# Patient Record
Sex: Male | Born: 1950
Health system: Southern US, Community
[De-identification: ages and names within clinical notes are randomized; demographics above are authoritative.]

## PROBLEM LIST (undated history)

## (undated) DIAGNOSIS — E119 Type 2 diabetes mellitus without complications: Secondary | ICD-10-CM

## (undated) DIAGNOSIS — R7302 Impaired glucose tolerance (oral): Secondary | ICD-10-CM

## (undated) HISTORY — PX: ANTERIOR CRUCIATE LIGAMENT REPAIR: SHX115

## (undated) HISTORY — PX: EYE SURGERY: SHX253

## (undated) HISTORY — DX: Type 2 diabetes mellitus without complications: E11.9

## (undated) HISTORY — DX: Impaired glucose tolerance (oral): R73.02

## (undated) HISTORY — PX: ORIF ACROMIOCLAVICULAR JOINT: SUR916

---

## 2004-10-07 ENCOUNTER — Ambulatory Visit: Payer: Self-pay | Admitting: Gastroenterology

## 2004-10-15 ENCOUNTER — Ambulatory Visit: Payer: Self-pay | Admitting: Gastroenterology

## 2010-12-21 ENCOUNTER — Ambulatory Visit: Payer: 59 | Admitting: Family Medicine

## 2012-01-30 ENCOUNTER — Other Ambulatory Visit: Payer: Self-pay | Admitting: Family Medicine

## 2012-01-30 ENCOUNTER — Other Ambulatory Visit: Payer: 59

## 2012-01-30 ENCOUNTER — Other Ambulatory Visit: Payer: Self-pay

## 2012-01-30 DIAGNOSIS — IMO0002 Reserved for concepts with insufficient information to code with codable children: Secondary | ICD-10-CM

## 2012-01-30 DIAGNOSIS — M719 Bursopathy, unspecified: Secondary | ICD-10-CM

## 2012-01-30 MED ORDER — AMOXICILLIN-POT CLAVULANATE 875-125 MG PO TABS: 1.0000 | ORAL_TABLET | Freq: Two times a day (BID) | ORAL | Status: AC

## 2012-01-30 MED ORDER — DOXYCYCLINE HYCLATE 100 MG PO TABS: 100.0000 mg | ORAL_TABLET | Freq: Two times a day (BID) | ORAL | Status: AC

## 2012-02-06 ENCOUNTER — Other Ambulatory Visit: Payer: Self-pay | Admitting: Family Medicine

## 2012-02-06 MED ORDER — DICLOXACILLIN SODIUM 500 MG PO CAPS: 500.0000 mg | ORAL_CAPSULE | Freq: Four times a day (QID) | ORAL | Status: AC

## 2013-01-03 ENCOUNTER — Other Ambulatory Visit: Payer: Self-pay | Admitting: Internal Medicine

## 2013-01-03 LAB — COMPREHENSIVE METABOLIC PANEL
ALT: 22 U/L (ref 0–53)
AST: 21 U/L (ref 0–37)
BUN: 22 mg/dL (ref 6–23)
Calcium: 9.4 mg/dL (ref 8.4–10.5)
Chloride: 99 mEq/L (ref 96–112)
Creatinine, Ser: 1.1 mg/dL (ref 0.4–1.5)
GFR: 71.74 mL/min (ref >60.00)
Total Bilirubin: 1.3 mg/dL — ABNORMAL HIGH (ref 0.3–1.2)

## 2013-01-03 LAB — CBC WITH DIFFERENTIAL/PLATELET
Basophils Absolute: 0 10*3/uL (ref 0.0–0.1)
Basophils Relative: 0.3 % (ref 0.0–3.0)
Eosinophils Absolute: 0.3 10*3/uL (ref 0.0–0.7)
HCT: 44.6 % (ref 39.0–52.0)
Hemoglobin: 14.9 g/dL (ref 13.0–17.0)
Lymphs Abs: 1.4 10*3/uL (ref 0.7–4.0)
MCHC: 33.4 g/dL (ref 30.0–36.0)
MCV: 92.6 fl (ref 78.0–100.0)
Neutro Abs: 2.8 10*3/uL (ref 1.4–7.7)
RBC: 4.81 Mil/uL (ref 4.22–5.81)
RDW: 13.5 % (ref 11.5–14.6)

## 2013-01-03 LAB — TSH: TSH: 2.85 u[IU]/mL (ref 0.35–5.50)

## 2013-01-03 LAB — LIPID PANEL
HDL: 58.1 mg/dL (ref >39.00)
Total CHOL/HDL Ratio: 2
Triglycerides: 54 mg/dL (ref 0.0–149.0)
VLDL: 10.8 mg/dL (ref 0.0–40.0)

## 2013-01-03 LAB — PSA: PSA: 1.35 ng/mL (ref 0.10–4.00)

## 2013-04-22 ENCOUNTER — Other Ambulatory Visit: Payer: Self-pay | Admitting: Internal Medicine

## 2013-04-22 ENCOUNTER — Other Ambulatory Visit (INDEPENDENT_AMBULATORY_CARE_PROVIDER_SITE_OTHER): Payer: 59

## 2013-04-22 DIAGNOSIS — R7302 Impaired glucose tolerance (oral): Secondary | ICD-10-CM

## 2013-04-22 DIAGNOSIS — R7309 Other abnormal glucose: Secondary | ICD-10-CM

## 2013-11-27 ENCOUNTER — Other Ambulatory Visit: Payer: Self-pay | Admitting: *Deleted

## 2013-11-27 MED ORDER — ONDANSETRON HCL 8 MG PO TABS: 8.0000 mg | ORAL_TABLET | Freq: Three times a day (TID) | ORAL | Status: AC | PRN

## 2013-11-27 MED ORDER — CIPROFLOXACIN HCL 500 MG PO TABS: 500.0000 mg | ORAL_TABLET | Freq: Two times a day (BID) | ORAL | Status: DC

## 2013-11-27 MED ORDER — ZOLPIDEM TARTRATE 10 MG PO TABS: 10.0000 mg | ORAL_TABLET | Freq: Every evening | ORAL | Status: DC | PRN

## 2013-11-27 MED ORDER — NEOMYCIN-POLYMYXIN-HC 3.5-10000-1 OT SOLN: OTIC | Status: DC

## 2013-11-27 MED ORDER — SCOPOLAMINE 1 MG/3DAYS TD PT72: 1.0000 | MEDICATED_PATCH | TRANSDERMAL | Status: DC

## 2014-01-14 ENCOUNTER — Other Ambulatory Visit: Payer: Self-pay | Admitting: *Deleted

## 2014-01-14 MED ORDER — CHLORHEXIDINE GLUCONATE 0.12 % MT SOLN: 15.0000 mL | Freq: Two times a day (BID) | OROMUCOSAL | Status: DC

## 2014-10-09 ENCOUNTER — Other Ambulatory Visit (INDEPENDENT_AMBULATORY_CARE_PROVIDER_SITE_OTHER): Payer: 59

## 2014-10-09 DIAGNOSIS — M10079 Idiopathic gout, unspecified ankle and foot: Secondary | ICD-10-CM

## 2014-10-09 DIAGNOSIS — R739 Hyperglycemia, unspecified: Secondary | ICD-10-CM

## 2014-10-09 LAB — URIC ACID: Uric Acid, Serum: 5.7 mg/dL (ref 4.0–7.8)

## 2014-10-09 LAB — HEMOGLOBIN A1C: HEMOGLOBIN A1C: 6.5 % (ref 4.6–6.5)

## 2014-10-17 ENCOUNTER — Encounter: Payer: Self-pay | Admitting: Gastroenterology

## 2014-12-25 ENCOUNTER — Other Ambulatory Visit: Payer: Self-pay | Admitting: *Deleted

## 2014-12-25 MED ORDER — ATORVASTATIN CALCIUM 40 MG PO TABS: 40.0000 mg | ORAL_TABLET | Freq: Every day | ORAL | Status: DC

## 2014-12-25 MED ORDER — METFORMIN HCL ER 500 MG PO TB24: 2000.0000 mg | ORAL_TABLET | Freq: Every day | ORAL | Status: DC

## 2015-02-20 ENCOUNTER — Other Ambulatory Visit: Payer: Self-pay | Admitting: *Deleted

## 2015-02-20 MED ORDER — NAPROXEN 500 MG PO TABS: 500.0000 mg | ORAL_TABLET | Freq: Two times a day (BID) | ORAL | Status: DC

## 2015-04-07 ENCOUNTER — Other Ambulatory Visit: Payer: Self-pay | Admitting: *Deleted

## 2015-04-07 MED ORDER — ATORVASTATIN CALCIUM 40 MG PO TABS: 40.0000 mg | ORAL_TABLET | Freq: Every day | ORAL | Status: DC

## 2015-04-21 ENCOUNTER — Other Ambulatory Visit: Payer: Self-pay | Admitting: *Deleted

## 2015-04-21 MED ORDER — METFORMIN HCL ER 500 MG PO TB24: 1000.0000 mg | ORAL_TABLET | Freq: Two times a day (BID) | ORAL | Status: DC

## 2015-07-10 ENCOUNTER — Other Ambulatory Visit: Payer: Self-pay | Admitting: *Deleted

## 2015-07-10 MED ORDER — METFORMIN HCL ER 500 MG PO TB24: 1000.0000 mg | ORAL_TABLET | Freq: Two times a day (BID) | ORAL | Status: DC

## 2015-07-10 MED ORDER — ATORVASTATIN CALCIUM 40 MG PO TABS: 40.0000 mg | ORAL_TABLET | Freq: Every day | ORAL | Status: DC

## 2015-08-14 ENCOUNTER — Other Ambulatory Visit: Payer: Self-pay | Admitting: *Deleted

## 2015-08-14 MED ORDER — NEOMYCIN-POLYMYXIN-HC 3.5-10000-1 OT SOLN: OTIC | Status: DC

## 2015-08-18 ENCOUNTER — Other Ambulatory Visit: Payer: Self-pay | Admitting: *Deleted

## 2015-08-18 MED ORDER — ENALAPRIL MALEATE 10 MG PO TABS: 10.0000 mg | ORAL_TABLET | Freq: Every day | ORAL | Status: DC

## 2015-09-24 ENCOUNTER — Other Ambulatory Visit: Payer: Self-pay | Admitting: *Deleted

## 2015-09-24 MED ORDER — FUROSEMIDE 40 MG PO TABS: 40.0000 mg | ORAL_TABLET | Freq: Every day | ORAL | Status: DC

## 2015-09-24 MED ORDER — FUROSEMIDE 20 MG PO TABS: 20.0000 mg | ORAL_TABLET | Freq: Every day | ORAL | Status: DC

## 2015-11-05 ENCOUNTER — Other Ambulatory Visit (INDEPENDENT_AMBULATORY_CARE_PROVIDER_SITE_OTHER): Payer: 59 | Admitting: *Deleted

## 2015-11-05 ENCOUNTER — Encounter: Payer: Self-pay | Admitting: Gastroenterology

## 2015-11-05 DIAGNOSIS — Z23 Encounter for immunization: Secondary | ICD-10-CM

## 2015-11-05 MED ORDER — ATORVASTATIN CALCIUM 40 MG PO TABS: 40.0000 mg | ORAL_TABLET | Freq: Every day | ORAL | Status: DC

## 2015-11-05 MED FILL — ATORVASTATIN 40 MG TABLET: 40 | 90 days supply | Qty: 90 | Fill #0

## 2015-11-11 ENCOUNTER — Other Ambulatory Visit: Payer: Self-pay | Admitting: *Deleted

## 2015-11-11 MED ORDER — FUROSEMIDE 40 MG PO TABS: 40.0000 mg | ORAL_TABLET | Freq: Two times a day (BID) | ORAL | Status: DC

## 2015-11-12 MED FILL — FUROSEMIDE 40 MG TABLET: 40 | 90 days supply | Qty: 180 | Fill #0

## 2016-01-08 ENCOUNTER — Other Ambulatory Visit: Payer: Self-pay | Admitting: *Deleted

## 2016-01-08 MED ORDER — METFORMIN HCL ER 500 MG PO TB24: 1000.0000 mg | ORAL_TABLET | Freq: Two times a day (BID) | ORAL | Status: DC

## 2016-01-08 MED ORDER — NAPROXEN 500 MG PO TABS: 500.0000 mg | ORAL_TABLET | Freq: Two times a day (BID) | ORAL | Status: DC

## 2016-01-22 MED FILL — ENALAPRIL MALEATE 10 MG TAB: 10 | 90 days supply | Qty: 90 | Fill #0

## 2016-01-22 MED FILL — NAPROXEN 500 MG TABLET: 500 | 90 days supply | Qty: 180 | Fill #0

## 2016-01-22 MED FILL — METFORMIN HCL ER 500 MG TAB: 500 | 90 days supply | Qty: 360 | Fill #0

## 2016-01-26 NOTE — Patient Instructions (Signed)
Impingement Syndrome, Rotator Cuff, Bursitis With Rehab °Impingement syndrome is a condition that involves inflammation of the tendons of the rotator cuff and the subacromial bursa, that causes pain in the shoulder. The rotator cuff consists of four tendons and muscles that control much of the shoulder and upper arm function. The subacromial bursa is a fluid filled sac that helps reduce friction between the rotator cuff and one of the bones of the shoulder (acromion). Impingement syndrome is usually an overuse injury that causes swelling of the bursa (bursitis), swelling of the tendon (tendonitis), and/or a tear of the tendon (strain). Strains are classified into three categories. Grade 1 strains cause pain, but the tendon is not lengthened. Grade 2 strains include a lengthened ligament, due to the ligament being stretched or partially ruptured. With grade 2 strains there is still function, although the function may be decreased. Grade 3 strains include a complete tear of the tendon or muscle, and function is usually impaired. °SYMPTOMS  °· Pain around the shoulder, often at the outer portion of the upper arm. °· Pain that gets worse with shoulder function, especially when reaching overhead or lifting. °· Sometimes, aching when not using the arm. °· Pain that wakes you up at night. °· Sometimes, tenderness, swelling, warmth, or redness over the affected area. °· Loss of strength. °· Limited motion of the shoulder, especially reaching behind the back (to the back pocket or to unhook bra) or across your body. °· Crackling sound (crepitation) when moving the arm. °· Biceps tendon pain and inflammation (in the front of the shoulder). Worse when bending the elbow or lifting. °CAUSES  °Impingement syndrome is often an overuse injury, in which chronic (repetitive) motions cause the tendons or bursa to become inflamed. A strain occurs when a force is paced on the tendon or muscle that is greater than it can withstand.  Common mechanisms of injury include: °Stress from sudden increase in duration, frequency, or intensity of training. °· Direct hit (trauma) to the shoulder. °· Aging, erosion of the tendon with normal use. °· Bony bump on shoulder (acromial spur). °RISK INCREASES WITH: °· Contact sports (football, wrestling, boxing). °· Throwing sports (baseball, tennis, volleyball). °· Weightlifting and bodybuilding. °· Heavy labor. °· Previous injury to the rotator cuff, including impingement. °· Poor shoulder strength and flexibility. °· Failure to warm up properly before activity. °· Inadequate protective equipment. °· Old age. °· Bony bump on shoulder (acromial spur). °PREVENTION  °· Warm up and stretch properly before activity. °· Allow for adequate recovery between workouts. °· Maintain physical fitness: °¨ Strength, flexibility, and endurance. °¨ Cardiovascular fitness. °· Learn and use proper exercise technique. °PROGNOSIS  °If treated properly, impingement syndrome usually goes away within 6 weeks. Sometimes surgery is required.  °RELATED COMPLICATIONS  °· Longer healing time if not properly treated, or if not given enough time to heal. °· Recurring symptoms, that result in a chronic condition. °· Shoulder stiffness, frozen shoulder, or loss of motion. °· Rotator cuff tendon tear. °· Recurring symptoms, especially if activity is resumed too soon, with overuse, with a direct blow, or when using poor technique. °TREATMENT  °Treatment first involves the use of ice and medicine, to reduce pain and inflammation. The use of strengthening and stretching exercises may help reduce pain with activity. These exercises may be performed at home or with a therapist. If non-surgical treatment is unsuccessful after more than 6 months, surgery may be advised. After surgery and rehabilitation, activity is usually possible in 3 months.  °  MEDICATION  If pain medicine is needed, nonsteroidal anti-inflammatory medicines (aspirin and  ibuprofen), or other minor pain relievers (acetaminophen), are often advised.  Do not take pain medicine for 7 days before surgery.  Prescription pain relievers may be given, if your caregiver thinks they are needed. Use only as directed and only as much as you need.  Corticosteroid injections may be given by your caregiver. These injections should be reserved for the most serious cases, because they may only be given a certain number of times. HEAT AND COLD  Cold treatment (icing) should be applied for 10 to 15 minutes every 2 to 3 hours for inflammation and pain, and immediately after activity that aggravates your symptoms. Use ice packs or an ice massage.  Heat treatment may be used before performing stretching and strengthening activities prescribed by your caregiver, physical therapist, or athletic trainer. Use a heat pack or a warm water soak. SEEK MEDICAL CARE IF:   Symptoms get worse or do not improve in 4 to 6 weeks, despite treatment.  New, unexplained symptoms develop. (Drugs used in treatment may produce side effects.) EXERCISES  RANGE OF MOTION (ROM) AND STRETCHING EXERCISES - Impingement Syndrome (Rotator Cuff  Tendinitis, Bursitis) These exercises may help you when beginning to rehabilitate your injury. Your symptoms may go away with or without further involvement from your physician, physical therapist or athletic trainer. While completing these exercises, remember:   Restoring tissue flexibility helps normal motion to return to the joints. This allows healthier, less painful movement and activity.  An effective stretch should be held for at least 30 seconds.  A stretch should never be painful. You should only feel a gentle lengthening or release in the stretched tissue. STRETCH - Flexion, Standing  Stand with good posture. With an underhand grip on your right / left hand, and an overhand grip on the opposite hand, grasp a broomstick or cane so that your hands are a  little more than shoulder width apart.  Keeping your right / left elbow straight and shoulder muscles relaxed, push the stick with your opposite hand, to raise your right / left arm in front of your body and then overhead. Raise your arm until you feel a stretch in your right / left shoulder, but before you have increased shoulder pain.  Try to avoid shrugging your right / left shoulder as your arm rises, by keeping your shoulder blade tucked down and toward your mid-back spine. Hold for __________ seconds.  Slowly return to the starting position. Repeat __________ times. Complete this exercise __________ times per day. STRETCH - Abduction, Supine  Lie on your back. With an underhand grip on your right / left hand and an overhand grip on the opposite hand, grasp a broomstick or cane so that your hands are a little more than shoulder width apart.  Keeping your right / left elbow straight and your shoulder muscles relaxed, push the stick with your opposite hand, to raise your right / left arm out to the side of your body and then overhead. Raise your arm until you feel a stretch in your right / left shoulder, but before you have increased shoulder pain.  Try to avoid shrugging your right / left shoulder as your arm rises, by keeping your shoulder blade tucked down and toward your mid-back spine. Hold for __________ seconds.  Slowly return to the starting position. Repeat __________ times. Complete this exercise __________ times per day. ROM - Flexion, Active-Assisted  Lie on your back.  You may bend your knees for comfort.  Grasp a broomstick or cane so your hands are about shoulder width apart. Your right / left hand should grip the end of the stick, so that your hand is positioned "thumbs-up," as if you were about to shake hands.  Using your healthy arm to lead, raise your right / left arm overhead, until you feel a gentle stretch in your shoulder. Hold for __________ seconds.  Use the stick  to assist in returning your right / left arm to its starting position. Repeat __________ times. Complete this exercise __________ times per day.  ROM - Internal Rotation, Supine   Lie on your back on a firm surface. Place your right / left elbow about 60 degrees away from your side. Elevate your elbow with a folded towel, so that the elbow and shoulder are the same height.  Using a broomstick or cane and your strong arm, pull your right / left hand toward your body until you feel a gentle stretch, but no increase in your shoulder pain. Keep your shoulder and elbow in place throughout the exercise.  Hold for __________ seconds. Slowly return to the starting position. Repeat __________ times. Complete this exercise __________ times per day. STRETCH - Internal Rotation  Place your right / left hand behind your back, palm up.  Throw a towel or belt over your opposite shoulder. Grasp the towel with your right / left hand.  While keeping an upright posture, gently pull up on the towel, until you feel a stretch in the front of your right / left shoulder.  Avoid shrugging your right / left shoulder as your arm rises, by keeping your shoulder blade tucked down and toward your mid-back spine.  Hold for __________ seconds. Release the stretch, by lowering your healthy hand. Repeat __________ times. Complete this exercise __________ times per day. ROM - Internal Rotation   Using an underhand grip, grasp a stick behind your back with both hands.  While standing upright with good posture, slide the stick up your back until you feel a mild stretch in the front of your shoulder.  Hold for __________ seconds. Slowly return to your starting position. Repeat __________ times. Complete this exercise __________ times per day.  STRETCH - Posterior Shoulder Capsule   Stand or sit with good posture. Grasp your right / left elbow and draw it across your chest, keeping it at the same height as your  shoulder.  Pull your elbow, so your upper arm comes in closer to your chest. Pull until you feel a gentle stretch in the back of your shoulder.  Hold for __________ seconds. Repeat __________ times. Complete this exercise __________ times per day. STRENGTHENING EXERCISES - Impingement Syndrome (Rotator Cuff Tendinitis, Bursitis) These exercises may help you when beginning to rehabilitate your injury. They may resolve your symptoms with or without further involvement from your physician, physical therapist or athletic trainer. While completing these exercises, remember:  Muscles can gain both the endurance and the strength needed for everyday activities through controlled exercises.  Complete these exercises as instructed by your physician, physical therapist or athletic trainer. Increase the resistance and repetitions only as guided.  You may experience muscle soreness or fatigue, but the pain or discomfort you are trying to eliminate should never worsen during these exercises. If this pain does get worse, stop and make sure you are following the directions exactly. If the pain is still present after adjustments, discontinue the exercise until you can discuss   the trouble with your clinician.  During your recovery, avoid activity or exercises which involve actions that place your injured hand or elbow above your head or behind your back or head. These positions stress the tissues which you are trying to heal. STRENGTH - Scapular Depression and Adduction   With good posture, sit on a firm chair. Support your arms in front of you, with pillows, arm rests, or on a table top. Have your elbows in line with the sides of your body.  Gently draw your shoulder blades down and toward your mid-back spine. Gradually increase the tension, without tensing the muscles along the top of your shoulders and the back of your neck.  Hold for __________ seconds. Slowly release the tension and relax your muscles  completely before starting the next repetition.  After you have practiced this exercise, remove the arm support and complete the exercise in standing as well as sitting position. Repeat __________ times. Complete this exercise __________ times per day.  STRENGTH - Shoulder Abductors, Isometric  With good posture, stand or sit about 4-6 inches from a wall, with your right / left side facing the wall.  Bend your right / left elbow. Gently press your right / left elbow into the wall. Increase the pressure gradually, until you are pressing as hard as you can, without shrugging your shoulder or increasing any shoulder discomfort.  Hold for __________ seconds.  Release the tension slowly. Relax your shoulder muscles completely before you begin the next repetition. Repeat __________ times. Complete this exercise __________ times per day.  STRENGTH - External Rotators, Isometric  Keep your right / left elbow at your side and bend it 90 degrees.  Step into a door frame so that the outside of your right / left wrist can press against the door frame without your upper arm leaving your side.  Gently press your right / left wrist into the door frame, as if you were trying to swing the back of your hand away from your stomach. Gradually increase the tension, until you are pressing as hard as you can, without shrugging your shoulder or increasing any shoulder discomfort.  Hold for __________ seconds.  Release the tension slowly. Relax your shoulder muscles completely before you begin the next repetition. Repeat __________ times. Complete this exercise __________ times per day.  STRENGTH - Supraspinatus   Stand or sit with good posture. Grasp a __________ weight, or an exercise band or tubing, so that your hand is "thumbs-up," like you are shaking hands.  Slowly lift your right / left arm in a "V" away from your thigh, diagonally into the space between your side and straight ahead. Lift your hand to  shoulder height or as far as you can, without increasing any shoulder pain. At first, many people do not lift their hands above shoulder height.  Avoid shrugging your right / left shoulder as your arm rises, by keeping your shoulder blade tucked down and toward your mid-back spine.  Hold for __________ seconds. Control the descent of your hand, as you slowly return to your starting position. Repeat __________ times. Complete this exercise __________ times per day.  STRENGTH - External Rotators  Secure a rubber exercise band or tubing to a fixed object (table, pole) so that it is at the same height as your right / left elbow when you are standing or sitting on a firm surface.  Stand or sit so that the secured exercise band is at your uninjured side.  Bend   your right / left elbow 90 degrees. Place a folded towel or small pillow under your right / left arm, so that your elbow is a few inches away from your side.  Keeping the tension on the exercise band, pull it away from your body, as if pivoting on your elbow. Be sure to keep your body steady, so that the movement is coming only from your rotating shoulder.  Hold for __________ seconds. Release the tension in a controlled manner, as you return to the starting position. Repeat __________ times. Complete this exercise __________ times per day.  STRENGTH - Internal Rotators   Secure a rubber exercise band or tubing to a fixed object (table, pole) so that it is at the same height as your right / left elbow when you are standing or sitting on a firm surface.  Stand or sit so that the secured exercise band is at your right / left side.  Bend your elbow 90 degrees. Place a folded towel or small pillow under your right / left arm so that your elbow is a few inches away from your side.  Keeping the tension on the exercise band, pull it across your body, toward your stomach. Be sure to keep your body steady, so that the movement is coming only from  your rotating shoulder.  Hold for __________ seconds. Release the tension in a controlled manner, as you return to the starting position. Repeat __________ times. Complete this exercise __________ times per day.  STRENGTH - Scapular Protractors, Standing   Stand arms length away from a wall. Place your hands on the wall, keeping your elbows straight.  Begin by dropping your shoulder blades down and toward your mid-back spine.  To strengthen your protractors, keep your shoulder blades down, but slide them forward on your rib cage. It will feel as if you are lifting the back of your rib cage away from the wall. This is a subtle motion and can be challenging to complete. Ask your caregiver for further instruction, if you are not sure you are doing the exercise correctly.  Hold for __________ seconds. Slowly return to the starting position, resting the muscles completely before starting the next repetition. Repeat __________ times. Complete this exercise __________ times per day. STRENGTH - Scapular Protractors, Supine  Lie on your back on a firm surface. Extend your right / left arm straight into the air while holding a __________ weight in your hand.  Keeping your head and back in place, lift your shoulder off the floor.  Hold for __________ seconds. Slowly return to the starting position, and allow your muscles to relax completely before starting the next repetition. Repeat __________ times. Complete this exercise __________ times per day. STRENGTH - Scapular Protractors, Quadruped  Get onto your hands and knees, with your shoulders directly over your hands (or as close as you can be, comfortably).  Keeping your elbows locked, lift the back of your rib cage up into your shoulder blades, so your mid-back rounds out. Keep your neck muscles relaxed.  Hold this position for __________ seconds. Slowly return to the starting position and allow your muscles to relax completely before starting the  next repetition. Repeat __________ times. Complete this exercise __________ times per day.  STRENGTH - Scapular Retractors  Secure a rubber exercise band or tubing to a fixed object (table, pole), so that it is at the height of your shoulders when you are either standing, or sitting on a firm armless chair.  With a   palm down grip, grasp an end of the band in each hand. Straighten your elbows and lift your hands straight in front of you, at shoulder height. Step back, away from the secured end of the band, until it becomes tense.  Squeezing your shoulder blades together, draw your elbows back toward your sides, as you bend them. Keep your upper arms lifted away from your body throughout the exercise.  Hold for __________ seconds. Slowly ease the tension on the band, as you reverse the directions and return to the starting position. Repeat __________ times. Complete this exercise __________ times per day. STRENGTH - Shoulder Extensors   Secure a rubber exercise band or tubing to a fixed object (table, pole) so that it is at the height of your shoulders when you are either standing, or sitting on a firm armless chair.  With a thumbs-up grip, grasp an end of the band in each hand. Straighten your elbows and lift your hands straight in front of you, at shoulder height. Step back, away from the secured end of the band, until it becomes tense.  Squeezing your shoulder blades together, pull your hands down to the sides of your thighs. Do not allow your hands to go behind you.  Hold for __________ seconds. Slowly ease the tension on the band, as you reverse the directions and return to the starting position. Repeat __________ times. Complete this exercise __________ times per day.  STRENGTH - Scapular Retractors and External Rotators   Secure a rubber exercise band or tubing to a fixed object (table, pole) so that it is at the height as your shoulders, when you are either standing, or sitting on a  firm armless chair.  With a palm down grip, grasp an end of the band in each hand. Bend your elbows 90 degrees and lift your elbows to shoulder height, at your sides. Step back, away from the secured end of the band, until it becomes tense.  Squeezing your shoulder blades together, rotate your shoulders so that your upper arms and elbows remain stationary, but your fists travel upward to head height.  Hold for __________ seconds. Slowly ease the tension on the band, as you reverse the directions and return to the starting position. Repeat __________ times. Complete this exercise __________ times per day.  STRENGTH - Scapular Retractors and External Rotators, Rowing   Secure a rubber exercise band or tubing to a fixed object (table, pole) so that it is at the height of your shoulders, when you are either standing, or sitting on a firm armless chair.  With a palm down grip, grasp an end of the band in each hand. Straighten your elbows and lift your hands straight in front of you, at shoulder height. Step back, away from the secured end of the band, until it becomes tense.  Step 1: Squeeze your shoulder blades together. Bending your elbows, draw your hands to your chest, as if you are rowing a boat. At the end of this motion, your hands and elbow should be at shoulder height and your elbows should be out to your sides.  Step 2: Rotate your shoulders, to raise your hands above your head. Your forearms should be vertical and your upper arms should be horizontal.  Hold for __________ seconds. Slowly ease the tension on the band, as you reverse the directions and return to the starting position. Repeat __________ times. Complete this exercise __________ times per day.  STRENGTH - Scapular Depressors  Find a sturdy chair   without wheels, such as a dining room chair.  Keeping your feet on the floor, and your hands on the chair arms, lift your bottom up from the seat, and lock your elbows.  Keeping  your elbows straight, allow gravity to pull your body weight down. Your shoulders will rise toward your ears.  Raise your body against gravity by drawing your shoulder blades down your back, shortening the distance between your shoulders and ears. Although your feet should always maintain contact with the floor, your feet should progressively support less body weight, as you get stronger.  Hold for __________ seconds. In a controlled and slow manner, lower your body weight to begin the next repetition. Repeat __________ times. Complete this exercise __________ times per day.    This information is not intended to replace advice given to you by your health care provider. Make sure you discuss any questions you have with your health care provider.   Document Released: 09/26/2005 Document Revised: 10/17/2014 Document Reviewed: 01/08/2009 [employer] Patient Education 2016 [employer] Impingement Syndrome, Rotator Cuff, Bursitis With Rehab Impingement syndrome is a condition that involves inflammation of the tendons of the rotator cuff and the subacromial bursa, that causes pain in the shoulder. The rotator cuff consists of four tendons and muscles that control much of the shoulder and upper arm function. The subacromial bursa is a fluid filled sac that helps reduce friction between the rotator cuff and one of the bones of the shoulder (acromion). Impingement syndrome is usually an overuse injury that causes swelling of the bursa (bursitis), swelling of the tendon (tendonitis), and/or a tear of the tendon (strain). Strains are classified into three categories. Grade 1 strains cause pain, but the tendon is not lengthened. Grade 2 strains include a lengthened ligament, due to the ligament being stretched or partially ruptured. With grade 2 strains there is still function, although the function may be decreased. Grade 3 strains include a complete tear of the tendon or muscle, and function is usually  impaired. SYMPTOMS   Pain around the shoulder, often at the outer portion of the upper arm.  Pain that gets worse with shoulder function, especially when reaching overhead or lifting.  Sometimes, aching when not using the arm.  Pain that wakes you up at night.  Sometimes, tenderness, swelling, warmth, or redness over the affected area.  Loss of strength.  Limited motion of the shoulder, especially reaching behind the back (to the back pocket or to unhook bra) or across your body.  Crackling sound (crepitation) when moving the arm.  Biceps tendon pain and inflammation (in the front of the shoulder). Worse when bending the elbow or lifting. CAUSES  Impingement syndrome is often an overuse injury, in which chronic (repetitive) motions cause the tendons or bursa to become inflamed. A strain occurs when a force is paced on the tendon or muscle that is greater than it can withstand. Common mechanisms of injury include: Stress from sudden increase in duration, frequency, or intensity of training.  Direct hit (trauma) to the shoulder.  Aging, erosion of the tendon with normal use.  Bony bump on shoulder (acromial spur). RISK INCREASES WITH:  Contact sports (football, wrestling, boxing).  Throwing sports (baseball, tennis, volleyball).  Weightlifting and bodybuilding.  Heavy labor.  Previous injury to the rotator cuff, including impingement.  Poor shoulder strength and flexibility.  Failure to warm up properly before activity.  Inadequate protective equipment.  Old age.  Bony bump on shoulder (acromial spur). PREVENTION   Warm  up and stretch properly before activity.  Allow for adequate recovery between workouts.  Maintain physical fitness:  Strength, flexibility, and endurance.  Cardiovascular fitness.  Learn and use proper exercise technique. PROGNOSIS  If treated properly, impingement syndrome usually goes away within 6 weeks. Sometimes surgery is required.    RELATED COMPLICATIONS   Longer healing time if not properly treated, or if not given enough time to heal.  Recurring symptoms, that result in a chronic condition.  Shoulder stiffness, frozen shoulder, or loss of motion.  Rotator cuff tendon tear.  Recurring symptoms, especially if activity is resumed too soon, with overuse, with a direct blow, or when using poor technique. TREATMENT  Treatment first involves the use of ice and medicine, to reduce pain and inflammation. The use of strengthening and stretching exercises may help reduce pain with activity. These exercises may be performed at home or with a therapist. If non-surgical treatment is unsuccessful after more than 6 months, surgery may be advised. After surgery and rehabilitation, activity is usually possible in 3 months.  MEDICATION  If pain medicine is needed, nonsteroidal anti-inflammatory medicines (aspirin and ibuprofen), or other minor pain relievers (acetaminophen), are often advised.  Do not take pain medicine for 7 days before surgery.  Prescription pain relievers may be given, if your caregiver thinks they are needed. Use only as directed and only as much as you need.  Corticosteroid injections may be given by your caregiver. These injections should be reserved for the most serious cases, because they may only be given a certain number of times. HEAT AND COLD  Cold treatment (icing) should be applied for 10 to 15 minutes every 2 to 3 hours for inflammation and pain, and immediately after activity that aggravates your symptoms. Use ice packs or an ice massage.  Heat treatment may be used before performing stretching and strengthening activities prescribed by your caregiver, physical therapist, or athletic trainer. Use a heat pack or a warm water soak. SEEK MEDICAL CARE IF:   Symptoms get worse or do not improve in 4 to 6 weeks, despite treatment.  New, unexplained symptoms develop. (Drugs used in treatment may produce  side effects.) EXERCISES  RANGE OF MOTION (ROM) AND STRETCHING EXERCISES - Impingement Syndrome (Rotator Cuff  Tendinitis, Bursitis) These exercises may help you when beginning to rehabilitate your injury. Your symptoms may go away with or without further involvement from your physician, physical therapist or athletic trainer. While completing these exercises, remember:   Restoring tissue flexibility helps normal motion to return to the joints. This allows healthier, less painful movement and activity.  An effective stretch should be held for at least 30 seconds.  A stretch should never be painful. You should only feel a gentle lengthening or release in the stretched tissue. STRETCH - Flexion, Standing  Stand with good posture. With an underhand grip on your right / left hand, and an overhand grip on the opposite hand, grasp a broomstick or cane so that your hands are a little more than shoulder width apart.  Keeping your right / left elbow straight and shoulder muscles relaxed, push the stick with your opposite hand, to raise your right / left arm in front of your body and then overhead. Raise your arm until you feel a stretch in your right / left shoulder, but before you have increased shoulder pain.  Try to avoid shrugging your right / left shoulder as your arm rises, by keeping your shoulder blade tucked down and toward your mid-back  spine. Hold for __________ seconds.  Slowly return to the starting position. Repeat __________ times. Complete this exercise __________ times per day. STRETCH - Abduction, Supine  Lie on your back. With an underhand grip on your right / left hand and an overhand grip on the opposite hand, grasp a broomstick or cane so that your hands are a little more than shoulder width apart.  Keeping your right / left elbow straight and your shoulder muscles relaxed, push the stick with your opposite hand, to raise your right / left arm out to the side of your body and  then overhead. Raise your arm until you feel a stretch in your right / left shoulder, but before you have increased shoulder pain.  Try to avoid shrugging your right / left shoulder as your arm rises, by keeping your shoulder blade tucked down and toward your mid-back spine. Hold for __________ seconds.  Slowly return to the starting position. Repeat __________ times. Complete this exercise __________ times per day. ROM - Flexion, Active-Assisted  Lie on your back. You may bend your knees for comfort.  Grasp a broomstick or cane so your hands are about shoulder width apart. Your right / left hand should grip the end of the stick, so that your hand is positioned "thumbs-up," as if you were about to shake hands.  Using your healthy arm to lead, raise your right / left arm overhead, until you feel a gentle stretch in your shoulder. Hold for __________ seconds.  Use the stick to assist in returning your right / left arm to its starting position. Repeat __________ times. Complete this exercise __________ times per day.  ROM - Internal Rotation, Supine   Lie on your back on a firm surface. Place your right / left elbow about 60 degrees away from your side. Elevate your elbow with a folded towel, so that the elbow and shoulder are the same height.  Using a broomstick or cane and your strong arm, pull your right / left hand toward your body until you feel a gentle stretch, but no increase in your shoulder pain. Keep your shoulder and elbow in place throughout the exercise.  Hold for __________ seconds. Slowly return to the starting position. Repeat __________ times. Complete this exercise __________ times per day. STRETCH - Internal Rotation  Place your right / left hand behind your back, palm up.  Throw a towel or belt over your opposite shoulder. Grasp the towel with your right / left hand.  While keeping an upright posture, gently pull up on the towel, until you feel a stretch in the front  of your right / left shoulder.  Avoid shrugging your right / left shoulder as your arm rises, by keeping your shoulder blade tucked down and toward your mid-back spine.  Hold for __________ seconds. Release the stretch, by lowering your healthy hand. Repeat __________ times. Complete this exercise __________ times per day. ROM - Internal Rotation   Using an underhand grip, grasp a stick behind your back with both hands.  While standing upright with good posture, slide the stick up your back until you feel a mild stretch in the front of your shoulder.  Hold for __________ seconds. Slowly return to your starting position. Repeat __________ times. Complete this exercise __________ times per day.  STRETCH - Posterior Shoulder Capsule   Stand or sit with good posture. Grasp your right / left elbow and draw it across your chest, keeping it at the same height as your  shoulder.  Pull your elbow, so your upper arm comes in closer to your chest. Pull until you feel a gentle stretch in the back of your shoulder.  Hold for __________ seconds. Repeat __________ times. Complete this exercise __________ times per day. STRENGTHENING EXERCISES - Impingement Syndrome (Rotator Cuff Tendinitis, Bursitis) These exercises may help you when beginning to rehabilitate your injury. They may resolve your symptoms with or without further involvement from your physician, physical therapist or athletic trainer. While completing these exercises, remember:  Muscles can gain both the endurance and the strength needed for everyday activities through controlled exercises.  Complete these exercises as instructed by your physician, physical therapist or athletic trainer. Increase the resistance and repetitions only as guided.  You may experience muscle soreness or fatigue, but the pain or discomfort you are trying to eliminate should never worsen during these exercises. If this pain does get worse, stop and make sure you  are following the directions exactly. If the pain is still present after adjustments, discontinue the exercise until you can discuss the trouble with your clinician.  During your recovery, avoid activity or exercises which involve actions that place your injured hand or elbow above your head or behind your back or head. These positions stress the tissues which you are trying to heal. STRENGTH - Scapular Depression and Adduction   With good posture, sit on a firm chair. Support your arms in front of you, with pillows, arm rests, or on a table top. Have your elbows in line with the sides of your body.  Gently draw your shoulder blades down and toward your mid-back spine. Gradually increase the tension, without tensing the muscles along the top of your shoulders and the back of your neck.  Hold for __________ seconds. Slowly release the tension and relax your muscles completely before starting the next repetition.  After you have practiced this exercise, remove the arm support and complete the exercise in standing as well as sitting position. Repeat __________ times. Complete this exercise __________ times per day.  STRENGTH - Shoulder Abductors, Isometric  With good posture, stand or sit about 4-6 inches from a wall, with your right / left side facing the wall.  Bend your right / left elbow. Gently press your right / left elbow into the wall. Increase the pressure gradually, until you are pressing as hard as you can, without shrugging your shoulder or increasing any shoulder discomfort.  Hold for __________ seconds.  Release the tension slowly. Relax your shoulder muscles completely before you begin the next repetition. Repeat __________ times. Complete this exercise __________ times per day.  STRENGTH - External Rotators, Isometric  Keep your right / left elbow at your side and bend it 90 degrees.  Step into a door frame so that the outside of your right / left wrist can press against the  door frame without your upper arm leaving your side.  Gently press your right / left wrist into the door frame, as if you were trying to swing the back of your hand away from your stomach. Gradually increase the tension, until you are pressing as hard as you can, without shrugging your shoulder or increasing any shoulder discomfort.  Hold for __________ seconds.  Release the tension slowly. Relax your shoulder muscles completely before you begin the next repetition. Repeat __________ times. Complete this exercise __________ times per day.  STRENGTH - Supraspinatus   Stand or sit with good posture. Grasp a __________ weight, or an exercise band  or tubing, so that your hand is "thumbs-up," like you are shaking hands.  Slowly lift your right / left arm in a "V" away from your thigh, diagonally into the space between your side and straight ahead. Lift your hand to shoulder height or as far as you can, without increasing any shoulder pain. At first, many people do not lift their hands above shoulder height.  Avoid shrugging your right / left shoulder as your arm rises, by keeping your shoulder blade tucked down and toward your mid-back spine.  Hold for __________ seconds. Control the descent of your hand, as you slowly return to your starting position. Repeat __________ times. Complete this exercise __________ times per day.  STRENGTH - External Rotators  Secure a rubber exercise band or tubing to a fixed object (table, pole) so that it is at the same height as your right / left elbow when you are standing or sitting on a firm surface.  Stand or sit so that the secured exercise band is at your uninjured side.  Bend your right / left elbow 90 degrees. Place a folded towel or small pillow under your right / left arm, so that your elbow is a few inches away from your side.  Keeping the tension on the exercise band, pull it away from your body, as if pivoting on your elbow. Be sure to keep your body  steady, so that the movement is coming only from your rotating shoulder.  Hold for __________ seconds. Release the tension in a controlled manner, as you return to the starting position. Repeat __________ times. Complete this exercise __________ times per day.  STRENGTH - Internal Rotators   Secure a rubber exercise band or tubing to a fixed object (table, pole) so that it is at the same height as your right / left elbow when you are standing or sitting on a firm surface.  Stand or sit so that the secured exercise band is at your right / left side.  Bend your elbow 90 degrees. Place a folded towel or small pillow under your right / left arm so that your elbow is a few inches away from your side.  Keeping the tension on the exercise band, pull it across your body, toward your stomach. Be sure to keep your body steady, so that the movement is coming only from your rotating shoulder.  Hold for __________ seconds. Release the tension in a controlled manner, as you return to the starting position. Repeat __________ times. Complete this exercise __________ times per day.  STRENGTH - Scapular Protractors, Standing   Stand arms length away from a wall. Place your hands on the wall, keeping your elbows straight.  Begin by dropping your shoulder blades down and toward your mid-back spine.  To strengthen your protractors, keep your shoulder blades down, but slide them forward on your rib cage. It will feel as if you are lifting the back of your rib cage away from the wall. This is a subtle motion and can be challenging to complete. Ask your caregiver for further instruction, if you are not sure you are doing the exercise correctly.  Hold for __________ seconds. Slowly return to the starting position, resting the muscles completely before starting the next repetition. Repeat __________ times. Complete this exercise __________ times per day. STRENGTH - Scapular Protractors, Supine  Lie on your back on  a firm surface. Extend your right / left arm straight into the air while holding a __________ weight in your hand.  Keeping your head and back in place, lift your shoulder off the floor.  Hold for __________ seconds. Slowly return to the starting position, and allow your muscles to relax completely before starting the next repetition. Repeat __________ times. Complete this exercise __________ times per day. STRENGTH - Scapular Protractors, Quadruped  Get onto your hands and knees, with your shoulders directly over your hands (or as close as you can be, comfortably).  Keeping your elbows locked, lift the back of your rib cage up into your shoulder blades, so your mid-back rounds out. Keep your neck muscles relaxed.  Hold this position for __________ seconds. Slowly return to the starting position and allow your muscles to relax completely before starting the next repetition. Repeat __________ times. Complete this exercise __________ times per day.  STRENGTH - Scapular Retractors  Secure a rubber exercise band or tubing to a fixed object (table, pole), so that it is at the height of your shoulders when you are either standing, or sitting on a firm armless chair.  With a palm down grip, grasp an end of the band in each hand. Straighten your elbows and lift your hands straight in front of you, at shoulder height. Step back, away from the secured end of the band, until it becomes tense.  Squeezing your shoulder blades together, draw your elbows back toward your sides, as you bend them. Keep your upper arms lifted away from your body throughout the exercise.  Hold for __________ seconds. Slowly ease the tension on the band, as you reverse the directions and return to the starting position. Repeat __________ times. Complete this exercise __________ times per day. STRENGTH - Shoulder Extensors   Secure a rubber exercise band or tubing to a fixed object (table, pole) so that it is at the height of  your shoulders when you are either standing, or sitting on a firm armless chair.  With a thumbs-up grip, grasp an end of the band in each hand. Straighten your elbows and lift your hands straight in front of you, at shoulder height. Step back, away from the secured end of the band, until it becomes tense.  Squeezing your shoulder blades together, pull your hands down to the sides of your thighs. Do not allow your hands to go behind you.  Hold for __________ seconds. Slowly ease the tension on the band, as you reverse the directions and return to the starting position. Repeat __________ times. Complete this exercise __________ times per day.  STRENGTH - Scapular Retractors and External Rotators   Secure a rubber exercise band or tubing to a fixed object (table, pole) so that it is at the height as your shoulders, when you are either standing, or sitting on a firm armless chair.  With a palm down grip, grasp an end of the band in each hand. Bend your elbows 90 degrees and lift your elbows to shoulder height, at your sides. Step back, away from the secured end of the band, until it becomes tense.  Squeezing your shoulder blades together, rotate your shoulders so that your upper arms and elbows remain stationary, but your fists travel upward to head height.  Hold for __________ seconds. Slowly ease the tension on the band, as you reverse the directions and return to the starting position. Repeat __________ times. Complete this exercise __________ times per day.  STRENGTH - Scapular Retractors and External Rotators, Rowing   Secure a rubber exercise band or tubing to a fixed object (table, pole) so that it  is at the height of your shoulders, when you are either standing, or sitting on a firm armless chair.  With a palm down grip, grasp an end of the band in each hand. Straighten your elbows and lift your hands straight in front of you, at shoulder height. Step back, away from the secured end of the  band, until it becomes tense.  Step 1: Squeeze your shoulder blades together. Bending your elbows, draw your hands to your chest, as if you are rowing a boat. At the end of this motion, your hands and elbow should be at shoulder height and your elbows should be out to your sides.  Step 2: Rotate your shoulders, to raise your hands above your head. Your forearms should be vertical and your upper arms should be horizontal.  Hold for __________ seconds. Slowly ease the tension on the band, as you reverse the directions and return to the starting position. Repeat __________ times. Complete this exercise __________ times per day.  STRENGTH - Scapular Depressors  Find a sturdy chair without wheels, such as a dining room chair.  Keeping your feet on the floor, and your hands on the chair arms, lift your bottom up from the seat, and lock your elbows.  Keeping your elbows straight, allow gravity to pull your body weight down. Your shoulders will rise toward your ears.  Raise your body against gravity by drawing your shoulder blades down your back, shortening the distance between your shoulders and ears. Although your feet should always maintain contact with the floor, your feet should progressively support less body weight, as you get stronger.  Hold for __________ seconds. In a controlled and slow manner, lower your body weight to begin the next repetition. Repeat __________ times. Complete this exercise __________ times per day.    This information is not intended to replace advice given to you by your health care provider. Make sure you discuss any questions you have with your health care provider.   Document Released: 09/26/2005 Document Revised: 10/17/2014 Document Reviewed: 01/08/2009 [employer] Patient Education 2016 [employer] Impingement Syndrome, Rotator Cuff, Bursitis With Rehab Impingement syndrome is a condition that involves inflammation of the tendons of the rotator cuff  and the subacromial bursa, that causes pain in the shoulder. The rotator cuff consists of four tendons and muscles that control much of the shoulder and upper arm function. The subacromial bursa is a fluid filled sac that helps reduce friction between the rotator cuff and one of the bones of the shoulder (acromion). Impingement syndrome is usually an overuse injury that causes swelling of the bursa (bursitis), swelling of the tendon (tendonitis), and/or a tear of the tendon (strain). Strains are classified into three categories. Grade 1 strains cause pain, but the tendon is not lengthened. Grade 2 strains include a lengthened ligament, due to the ligament being stretched or partially ruptured. With grade 2 strains there is still function, although the function may be decreased. Grade 3 strains include a complete tear of the tendon or muscle, and function is usually impaired. SYMPTOMS   Pain around the shoulder, often at the outer portion of the upper arm.  Pain that gets worse with shoulder function, especially when reaching overhead or lifting.  Sometimes, aching when not using the arm.  Pain that wakes you up at night.  Sometimes, tenderness, swelling, warmth, or redness over the affected area.  Loss of strength.  Limited motion of the shoulder, especially reaching behind the back (to the back pocket  or to unhook bra) or across your body.  Crackling sound (crepitation) when moving the arm.  Biceps tendon pain and inflammation (in the front of the shoulder). Worse when bending the elbow or lifting. CAUSES  Impingement syndrome is often an overuse injury, in which chronic (repetitive) motions cause the tendons or bursa to become inflamed. A strain occurs when a force is paced on the tendon or muscle that is greater than it can withstand. Common mechanisms of injury include: Stress from sudden increase in duration, frequency, or intensity of training.  Direct hit (trauma) to the  shoulder.  Aging, erosion of the tendon with normal use.  Bony bump on shoulder (acromial spur). RISK INCREASES WITH:  Contact sports (football, wrestling, boxing).  Throwing sports (baseball, tennis, volleyball).  Weightlifting and bodybuilding.  Heavy labor.  Previous injury to the rotator cuff, including impingement.  Poor shoulder strength and flexibility.  Failure to warm up properly before activity.  Inadequate protective equipment.  Old age.  Bony bump on shoulder (acromial spur). PREVENTION   Warm up and stretch properly before activity.  Allow for adequate recovery between workouts.  Maintain physical fitness:  Strength, flexibility, and endurance.  Cardiovascular fitness.  Learn and use proper exercise technique. PROGNOSIS  If treated properly, impingement syndrome usually goes away within 6 weeks. Sometimes surgery is required.  RELATED COMPLICATIONS   Longer healing time if not properly treated, or if not given enough time to heal.  Recurring symptoms, that result in a chronic condition.  Shoulder stiffness, frozen shoulder, or loss of motion.  Rotator cuff tendon tear.  Recurring symptoms, especially if activity is resumed too soon, with overuse, with a direct blow, or when using poor technique. TREATMENT  Treatment first involves the use of ice and medicine, to reduce pain and inflammation. The use of strengthening and stretching exercises may help reduce pain with activity. These exercises may be performed at home or with a therapist. If non-surgical treatment is unsuccessful after more than 6 months, surgery may be advised. After surgery and rehabilitation, activity is usually possible in 3 months.  MEDICATION  If pain medicine is needed, nonsteroidal anti-inflammatory medicines (aspirin and ibuprofen), or other minor pain relievers (acetaminophen), are often advised.  Do not take pain medicine for 7 days before surgery.  Prescription pain  relievers may be given, if your caregiver thinks they are needed. Use only as directed and only as much as you need.  Corticosteroid injections may be given by your caregiver. These injections should be reserved for the most serious cases, because they may only be given a certain number of times. HEAT AND COLD  Cold treatment (icing) should be applied for 10 to 15 minutes every 2 to 3 hours for inflammation and pain, and immediately after activity that aggravates your symptoms. Use ice packs or an ice massage.  Heat treatment may be used before performing stretching and strengthening activities prescribed by your caregiver, physical therapist, or athletic trainer. Use a heat pack or a warm water soak. SEEK MEDICAL CARE IF:   Symptoms get worse or do not improve in 4 to 6 weeks, despite treatment.  New, unexplained symptoms develop. (Drugs used in treatment may produce side effects.) EXERCISES  RANGE OF MOTION (ROM) AND STRETCHING EXERCISES - Impingement Syndrome (Rotator Cuff  Tendinitis, Bursitis) These exercises may help you when beginning to rehabilitate your injury. Your symptoms may go away with or without further involvement from your physician, physical therapist or athletic trainer. While completing these exercises,  remember:   Restoring tissue flexibility helps normal motion to return to the joints. This allows healthier, less painful movement and activity.  An effective stretch should be held for at least 30 seconds.  A stretch should never be painful. You should only feel a gentle lengthening or release in the stretched tissue. STRETCH - Flexion, Standing  Stand with good posture. With an underhand grip on your right / left hand, and an overhand grip on the opposite hand, grasp a broomstick or cane so that your hands are a little more than shoulder width apart.  Keeping your right / left elbow straight and shoulder muscles relaxed, push the stick with your opposite hand, to  raise your right / left arm in front of your body and then overhead. Raise your arm until you feel a stretch in your right / left shoulder, but before you have increased shoulder pain.  Try to avoid shrugging your right / left shoulder as your arm rises, by keeping your shoulder blade tucked down and toward your mid-back spine. Hold for __________ seconds.  Slowly return to the starting position. Repeat __________ times. Complete this exercise __________ times per day. STRETCH - Abduction, Supine  Lie on your back. With an underhand grip on your right / left hand and an overhand grip on the opposite hand, grasp a broomstick or cane so that your hands are a little more than shoulder width apart.  Keeping your right / left elbow straight and your shoulder muscles relaxed, push the stick with your opposite hand, to raise your right / left arm out to the side of your body and then overhead. Raise your arm until you feel a stretch in your right / left shoulder, but before you have increased shoulder pain.  Try to avoid shrugging your right / left shoulder as your arm rises, by keeping your shoulder blade tucked down and toward your mid-back spine. Hold for __________ seconds.  Slowly return to the starting position. Repeat __________ times. Complete this exercise __________ times per day. ROM - Flexion, Active-Assisted  Lie on your back. You may bend your knees for comfort.  Grasp a broomstick or cane so your hands are about shoulder width apart. Your right / left hand should grip the end of the stick, so that your hand is positioned "thumbs-up," as if you were about to shake hands.  Using your healthy arm to lead, raise your right / left arm overhead, until you feel a gentle stretch in your shoulder. Hold for __________ seconds.  Use the stick to assist in returning your right / left arm to its starting position. Repeat __________ times. Complete this exercise __________ times per day.  ROM -  Internal Rotation, Supine   Lie on your back on a firm surface. Place your right / left elbow about 60 degrees away from your side. Elevate your elbow with a folded towel, so that the elbow and shoulder are the same height.  Using a broomstick or cane and your strong arm, pull your right / left hand toward your body until you feel a gentle stretch, but no increase in your shoulder pain. Keep your shoulder and elbow in place throughout the exercise.  Hold for __________ seconds. Slowly return to the starting position. Repeat __________ times. Complete this exercise __________ times per day. STRETCH - Internal Rotation  Place your right / left hand behind your back, palm up.  Throw a towel or belt over your opposite shoulder. Grasp the towel  with your right / left hand.  While keeping an upright posture, gently pull up on the towel, until you feel a stretch in the front of your right / left shoulder.  Avoid shrugging your right / left shoulder as your arm rises, by keeping your shoulder blade tucked down and toward your mid-back spine.  Hold for __________ seconds. Release the stretch, by lowering your healthy hand. Repeat __________ times. Complete this exercise __________ times per day. ROM - Internal Rotation   Using an underhand grip, grasp a stick behind your back with both hands.  While standing upright with good posture, slide the stick up your back until you feel a mild stretch in the front of your shoulder.  Hold for __________ seconds. Slowly return to your starting position. Repeat __________ times. Complete this exercise __________ times per day.  STRETCH - Posterior Shoulder Capsule   Stand or sit with good posture. Grasp your right / left elbow and draw it across your chest, keeping it at the same height as your shoulder.  Pull your elbow, so your upper arm comes in closer to your chest. Pull until you feel a gentle stretch in the back of your shoulder.  Hold for  __________ seconds. Repeat __________ times. Complete this exercise __________ times per day. STRENGTHENING EXERCISES - Impingement Syndrome (Rotator Cuff Tendinitis, Bursitis) These exercises may help you when beginning to rehabilitate your injury. They may resolve your symptoms with or without further involvement from your physician, physical therapist or athletic trainer. While completing these exercises, remember:  Muscles can gain both the endurance and the strength needed for everyday activities through controlled exercises.  Complete these exercises as instructed by your physician, physical therapist or athletic trainer. Increase the resistance and repetitions only as guided.  You may experience muscle soreness or fatigue, but the pain or discomfort you are trying to eliminate should never worsen during these exercises. If this pain does get worse, stop and make sure you are following the directions exactly. If the pain is still present after adjustments, discontinue the exercise until you can discuss the trouble with your clinician.  During your recovery, avoid activity or exercises which involve actions that place your injured hand or elbow above your head or behind your back or head. These positions stress the tissues which you are trying to heal. STRENGTH - Scapular Depression and Adduction   With good posture, sit on a firm chair. Support your arms in front of you, with pillows, arm rests, or on a table top. Have your elbows in line with the sides of your body.  Gently draw your shoulder blades down and toward your mid-back spine. Gradually increase the tension, without tensing the muscles along the top of your shoulders and the back of your neck.  Hold for __________ seconds. Slowly release the tension and relax your muscles completely before starting the next repetition.  After you have practiced this exercise, remove the arm support and complete the exercise in standing as well as  sitting position. Repeat __________ times. Complete this exercise __________ times per day.  STRENGTH - Shoulder Abductors, Isometric  With good posture, stand or sit about 4-6 inches from a wall, with your right / left side facing the wall.  Bend your right / left elbow. Gently press your right / left elbow into the wall. Increase the pressure gradually, until you are pressing as hard as you can, without shrugging your shoulder or increasing any shoulder discomfort.  Hold for __________  seconds.  Release the tension slowly. Relax your shoulder muscles completely before you begin the next repetition. Repeat __________ times. Complete this exercise __________ times per day.  STRENGTH - External Rotators, Isometric  Keep your right / left elbow at your side and bend it 90 degrees.  Step into a door frame so that the outside of your right / left wrist can press against the door frame without your upper arm leaving your side.  Gently press your right / left wrist into the door frame, as if you were trying to swing the back of your hand away from your stomach. Gradually increase the tension, until you are pressing as hard as you can, without shrugging your shoulder or increasing any shoulder discomfort.  Hold for __________ seconds.  Release the tension slowly. Relax your shoulder muscles completely before you begin the next repetition. Repeat __________ times. Complete this exercise __________ times per day.  STRENGTH - Supraspinatus   Stand or sit with good posture. Grasp a __________ weight, or an exercise band or tubing, so that your hand is "thumbs-up," like you are shaking hands.  Slowly lift your right / left arm in a "V" away from your thigh, diagonally into the space between your side and straight ahead. Lift your hand to shoulder height or as far as you can, without increasing any shoulder pain. At first, many people do not lift their hands above shoulder height.  Avoid shrugging  your right / left shoulder as your arm rises, by keeping your shoulder blade tucked down and toward your mid-back spine.  Hold for __________ seconds. Control the descent of your hand, as you slowly return to your starting position. Repeat __________ times. Complete this exercise __________ times per day.  STRENGTH - External Rotators  Secure a rubber exercise band or tubing to a fixed object (table, pole) so that it is at the same height as your right / left elbow when you are standing or sitting on a firm surface.  Stand or sit so that the secured exercise band is at your uninjured side.  Bend your right / left elbow 90 degrees. Place a folded towel or small pillow under your right / left arm, so that your elbow is a few inches away from your side.  Keeping the tension on the exercise band, pull it away from your body, as if pivoting on your elbow. Be sure to keep your body steady, so that the movement is coming only from your rotating shoulder.  Hold for __________ seconds. Release the tension in a controlled manner, as you return to the starting position. Repeat __________ times. Complete this exercise __________ times per day.  STRENGTH - Internal Rotators   Secure a rubber exercise band or tubing to a fixed object (table, pole) so that it is at the same height as your right / left elbow when you are standing or sitting on a firm surface.  Stand or sit so that the secured exercise band is at your right / left side.  Bend your elbow 90 degrees. Place a folded towel or small pillow under your right / left arm so that your elbow is a few inches away from your side.  Keeping the tension on the exercise band, pull it across your body, toward your stomach. Be sure to keep your body steady, so that the movement is coming only from your rotating shoulder.  Hold for __________ seconds. Release the tension in a controlled manner, as you return to  the starting position. Repeat __________ times.  Complete this exercise __________ times per day.  STRENGTH - Scapular Protractors, Standing   Stand arms length away from a wall. Place your hands on the wall, keeping your elbows straight.  Begin by dropping your shoulder blades down and toward your mid-back spine.  To strengthen your protractors, keep your shoulder blades down, but slide them forward on your rib cage. It will feel as if you are lifting the back of your rib cage away from the wall. This is a subtle motion and can be challenging to complete. Ask your caregiver for further instruction, if you are not sure you are doing the exercise correctly.  Hold for __________ seconds. Slowly return to the starting position, resting the muscles completely before starting the next repetition. Repeat __________ times. Complete this exercise __________ times per day. STRENGTH - Scapular Protractors, Supine  Lie on your back on a firm surface. Extend your right / left arm straight into the air while holding a __________ weight in your hand.  Keeping your head and back in place, lift your shoulder off the floor.  Hold for __________ seconds. Slowly return to the starting position, and allow your muscles to relax completely before starting the next repetition. Repeat __________ times. Complete this exercise __________ times per day. STRENGTH - Scapular Protractors, Quadruped  Get onto your hands and knees, with your shoulders directly over your hands (or as close as you can be, comfortably).  Keeping your elbows locked, lift the back of your rib cage up into your shoulder blades, so your mid-back rounds out. Keep your neck muscles relaxed.  Hold this position for __________ seconds. Slowly return to the starting position and allow your muscles to relax completely before starting the next repetition. Repeat __________ times. Complete this exercise __________ times per day.  STRENGTH - Scapular Retractors  Secure a rubber exercise band or  tubing to a fixed object (table, pole), so that it is at the height of your shoulders when you are either standing, or sitting on a firm armless chair.  With a palm down grip, grasp an end of the band in each hand. Straighten your elbows and lift your hands straight in front of you, at shoulder height. Step back, away from the secured end of the band, until it becomes tense.  Squeezing your shoulder blades together, draw your elbows back toward your sides, as you bend them. Keep your upper arms lifted away from your body throughout the exercise.  Hold for __________ seconds. Slowly ease the tension on the band, as you reverse the directions and return to the starting position. Repeat __________ times. Complete this exercise __________ times per day. STRENGTH - Shoulder Extensors   Secure a rubber exercise band or tubing to a fixed object (table, pole) so that it is at the height of your shoulders when you are either standing, or sitting on a firm armless chair.  With a thumbs-up grip, grasp an end of the band in each hand. Straighten your elbows and lift your hands straight in front of you, at shoulder height. Step back, away from the secured end of the band, until it becomes tense.  Squeezing your shoulder blades together, pull your hands down to the sides of your thighs. Do not allow your hands to go behind you.  Hold for __________ seconds. Slowly ease the tension on the band, as you reverse the directions and return to the starting position. Repeat __________ times. Complete this exercise  __________ times per day.  STRENGTH - Scapular Retractors and External Rotators   Secure a rubber exercise band or tubing to a fixed object (table, pole) so that it is at the height as your shoulders, when you are either standing, or sitting on a firm armless chair.  With a palm down grip, grasp an end of the band in each hand. Bend your elbows 90 degrees and lift your elbows to shoulder height, at your  sides. Step back, away from the secured end of the band, until it becomes tense.  Squeezing your shoulder blades together, rotate your shoulders so that your upper arms and elbows remain stationary, but your fists travel upward to head height.  Hold for __________ seconds. Slowly ease the tension on the band, as you reverse the directions and return to the starting position. Repeat __________ times. Complete this exercise __________ times per day.  STRENGTH - Scapular Retractors and External Rotators, Rowing   Secure a rubber exercise band or tubing to a fixed object (table, pole) so that it is at the height of your shoulders, when you are either standing, or sitting on a firm armless chair.  With a palm down grip, grasp an end of the band in each hand. Straighten your elbows and lift your hands straight in front of you, at shoulder height. Step back, away from the secured end of the band, until it becomes tense.  Step 1: Squeeze your shoulder blades together. Bending your elbows, draw your hands to your chest, as if you are rowing a boat. At the end of this motion, your hands and elbow should be at shoulder height and your elbows should be out to your sides.  Step 2: Rotate your shoulders, to raise your hands above your head. Your forearms should be vertical and your upper arms should be horizontal.  Hold for __________ seconds. Slowly ease the tension on the band, as you reverse the directions and return to the starting position. Repeat __________ times. Complete this exercise __________ times per day.  STRENGTH - Scapular Depressors  Find a sturdy chair without wheels, such as a dining room chair.  Keeping your feet on the floor, and your hands on the chair arms, lift your bottom up from the seat, and lock your elbows.  Keeping your elbows straight, allow gravity to pull your body weight down. Your shoulders will rise toward your ears.  Raise your body against gravity by drawing your  shoulder blades down your back, shortening the distance between your shoulders and ears. Although your feet should always maintain contact with the floor, your feet should progressively support less body weight, as you get stronger.  Hold for __________ seconds. In a controlled and slow manner, lower your body weight to begin the next repetition. Repeat __________ times. Complete this exercise __________ times per day.    This information is not intended to replace advice given to you by your health care provider. Make sure you discuss any questions you have with your health care provider.   Document Released: 09/26/2005 Document Revised: 10/17/2014 Document Reviewed: 01/08/2009 Else[employer] Interactive Patient Education [employer]

## 2016-02-10 DIAGNOSIS — M7552 Bursitis of left shoulder: Secondary | ICD-10-CM | POA: Diagnosis not present

## 2016-02-10 DIAGNOSIS — M7551 Bursitis of right shoulder: Secondary | ICD-10-CM | POA: Diagnosis not present

## 2016-03-21 MED FILL — FUROSEMIDE 40 MG TABLET: 40 | 90 days supply | Qty: 180 | Fill #1

## 2016-04-06 ENCOUNTER — Other Ambulatory Visit: Payer: Self-pay | Admitting: *Deleted

## 2016-04-06 MED ORDER — ATORVASTATIN CALCIUM 40 MG PO TABS: 40.0000 mg | ORAL_TABLET | Freq: Every day | ORAL | Status: DC

## 2016-04-06 MED FILL — ATORVASTATIN 40 MG TABLET: 40 | 90 days supply | Qty: 90 | Fill #0

## 2016-05-04 ENCOUNTER — Other Ambulatory Visit: Payer: Self-pay | Admitting: *Deleted

## 2016-05-04 MED ORDER — METFORMIN HCL ER 500 MG PO TB24: 1000.0000 mg | ORAL_TABLET | Freq: Two times a day (BID) | ORAL | 6 refills | Status: DC

## 2016-05-04 MED FILL — METFORMIN HCL ER 500 MG TAB: 500 | 90 days supply | Qty: 360 | Fill #0

## 2016-06-14 ENCOUNTER — Other Ambulatory Visit: Payer: Self-pay | Admitting: *Deleted

## 2016-06-14 MED ORDER — ACETAZOLAMIDE 125 MG PO TABS: 125.0000 mg | ORAL_TABLET | Freq: Two times a day (BID) | ORAL | 0 refills | Status: DC

## 2016-06-14 MED ORDER — MEFLOQUINE HCL 250 MG PO TABS: 250.0000 mg | ORAL_TABLET | ORAL | 0 refills | Status: DC

## 2016-06-14 MED FILL — acetaZOLAMIDE 125 MG TABS: 125 | 15 days supply | Qty: 30 | Fill #0

## 2016-06-14 MED FILL — MEFLOQUINE HCL 250 MG TAB: 250 | 90 days supply | Qty: 13 | Fill #0

## 2016-06-14 MED FILL — FUROSEMIDE 20 MG TABLET: 20 | 30 days supply | Qty: 120 | Fill #0

## 2016-06-15 MED FILL — NEO/POLYMYXIN/HC EAR SOLN: 3.5-10000-1 | 30 days supply | Qty: 10 | Fill #0

## 2016-07-06 ENCOUNTER — Other Ambulatory Visit: Payer: Self-pay | Admitting: *Deleted

## 2016-07-06 NOTE — Telephone Encounter (Signed)
Called pharmacy and asked them to get Rx Atorvastatin ready for pt to pick up.

## 2016-07-07 MED FILL — ATORVASTATIN 40 MG TABLET: 40 | 90 days supply | Qty: 90 | Fill #1 | Status: TO

## 2016-08-23 ENCOUNTER — Other Ambulatory Visit: Payer: Self-pay | Admitting: *Deleted

## 2016-08-23 MED ORDER — NAPROXEN 500 MG PO TABS: 500.0000 mg | ORAL_TABLET | Freq: Two times a day (BID) | ORAL | 3 refills | Status: AC

## 2016-08-23 MED FILL — NAPROXEN 500 MG TABLET: 500 | 90 days supply | Qty: 180 | Fill #0

## 2016-08-23 MED FILL — METFORMIN HCL ER 500 MG TAB: 500 | 90 days supply | Qty: 360 | Fill #0

## 2016-10-06 MED FILL — ATORVASTATIN 40 MG TABLET: 40 | 90 days supply | Qty: 90 | Fill #0

## 2017-01-26 ENCOUNTER — Other Ambulatory Visit: Payer: Self-pay | Admitting: *Deleted

## 2017-01-26 MED ORDER — ATORVASTATIN CALCIUM 40 MG PO TABS: 40.0000 mg | ORAL_TABLET | Freq: Every day | ORAL | 3 refills | Status: DC

## 2017-03-05 ENCOUNTER — Other Ambulatory Visit: Payer: Self-pay | Admitting: Family Medicine

## 2017-03-07 NOTE — Telephone Encounter (Signed)
Sent to the pharmacy by e-scribe per Dr. Tawanna Coolerodd.

## 2017-05-11 ENCOUNTER — Encounter: Payer: Self-pay | Admitting: Gastroenterology

## 2017-05-11 DIAGNOSIS — H40013 Open angle with borderline findings, low risk, bilateral: Secondary | ICD-10-CM | POA: Diagnosis not present

## 2017-05-11 DIAGNOSIS — H2513 Age-related nuclear cataract, bilateral: Secondary | ICD-10-CM | POA: Diagnosis not present

## 2017-05-11 DIAGNOSIS — E119 Type 2 diabetes mellitus without complications: Secondary | ICD-10-CM | POA: Diagnosis not present

## 2017-05-11 LAB — HM DIABETES EYE EXAM

## 2017-06-15 ENCOUNTER — Encounter: Payer: Self-pay | Admitting: Family Medicine

## 2017-06-22 ENCOUNTER — Encounter: Payer: Self-pay | Admitting: Gastroenterology

## 2017-06-22 ENCOUNTER — Ambulatory Visit (AMBULATORY_SURGERY_CENTER): Payer: Self-pay

## 2017-06-22 VITALS — Ht 71.0 in | Wt 160.4 lb

## 2017-06-22 DIAGNOSIS — Z1211 Encounter for screening for malignant neoplasm of colon: Secondary | ICD-10-CM

## 2017-06-22 MED ORDER — NA SULFATE-K SULFATE-MG SULF 17.5-3.13-1.6 GM/177ML PO SOLN: 1.0000 | Freq: Once | ORAL | 0 refills | Status: AC

## 2017-06-22 NOTE — Progress Notes (Signed)
Denies allergies to eggs or soy products. Denies complication of anesthesia or sedation. Denies use of weight loss medication. Denies use of O2.   Emmi instructions declined.  

## 2017-06-29 ENCOUNTER — Encounter: Payer: Self-pay | Admitting: Gastroenterology

## 2017-06-29 ENCOUNTER — Ambulatory Visit (AMBULATORY_SURGERY_CENTER): Payer: PPO | Admitting: Gastroenterology

## 2017-06-29 VITALS — BP 130/87 | HR 64 | Temp 98.0°F | Resp 14 | Ht 71.0 in | Wt 160.0 lb

## 2017-06-29 DIAGNOSIS — Z1212 Encounter for screening for malignant neoplasm of rectum: Secondary | ICD-10-CM | POA: Diagnosis not present

## 2017-06-29 DIAGNOSIS — Z1211 Encounter for screening for malignant neoplasm of colon: Secondary | ICD-10-CM | POA: Diagnosis not present

## 2017-06-29 MED ORDER — SODIUM CHLORIDE 0.9 % IV SOLN: 500.0000 mL | INTRAVENOUS | Status: DC

## 2017-06-29 NOTE — Patient Instructions (Signed)

## 2017-06-29 NOTE — Op Note (Signed)
[facility] Patient Name: Adrian Bryant Procedure Date: 06/29/2017 1:45 PM MRN: [id number] Endoscopist: Sherilyn Cooter L. Myrtie Neither , MD Age: 66 Referring MD:  Date of Birth: [birth year] Gender: Male Account #: [id number] Procedure:                Colonoscopy Indications:              Screening for colorectal malignant neoplasm (no                            polyps on 2006 colonoscopy) Medicines:                Monitored Anesthesia Care Procedure:                Pre-Anesthesia Assessment:                           - Prior to the procedure, a History and Physical                            was performed, and patient medications and                            allergies were reviewed. The patient's tolerance of                            previous anesthesia was also reviewed. The risks                            and benefits of the procedure and the sedation                            options and risks were discussed with the patient.                            All questions were answered, and informed consent                            was obtained. Prior Anticoagulants: The patient has                            taken no previous anticoagulant or antiplatelet                            agents. ASA Grade Assessment: II - A patient with                            mild systemic disease. After reviewing the risks                            and benefits, the patient was deemed in                            satisfactory condition to undergo the procedure.  After obtaining informed consent, the colonoscope                            was passed under direct vision. Throughout the                            procedure, the patient's blood pressure, pulse, and                            oxygen saturations were monitored continuously. The                            Model CF-HQ190L [id number] scope was introduced                            through the anus and advanced to  the the cecum,                            identified by the ileocecal valve and the crow's                            foot (cecum). The colonoscopy was performed with                            moderate difficulty due to inadequate bowel prep                            and significant looping. Successful completion of                            the procedure was aided by changing the patient to                            a supine position and using manual pressure. The                            patient tolerated the procedure well. The quality                            of the bowel preparation was poor, with little of                            the mucosa visualized. The ileocecal valve and the                            rectum were photographed. The patient chose to take                            Miralax rather than Suprep as prescribed. Scope In: 2:00:22 PM Scope Out: 2:14:16 PM Scope Withdrawal Time: 0 hours 3 minutes 4 seconds  Total Procedure Duration: 0 hours 13 minutes 54 seconds  Findings:  The perianal and digital rectal examinations were                            normal.                           Semi-liquid stool was found in the entire colon,                            precluding visualization. Complications:            No immediate complications. Estimated Blood Loss:     Estimated blood loss: none. Impression:               - Preparation of the colon was poor.                           - Stool in the entire examined colon.                           - No specimens collected. Recommendation:           - Patient has a contact number available for                            emergencies. The signs and symptoms of potential                            delayed complications were discussed with the                            patient. Return to normal activities tomorrow.                            Written discharge instructions were provided to the                             patient.                           - Resume previous diet.                           - Continue present medications.                           - Repeat colonoscopy at the next available                            appointment because the bowel preparation was poor. Henry L. Myrtie Neither, MD 06/29/2017 2:24:28 PM This report has been signed electronically.

## 2017-06-29 NOTE — Progress Notes (Signed)
VSS. Report to PACU [email or web address]

## 2017-06-30 ENCOUNTER — Other Ambulatory Visit (INDEPENDENT_AMBULATORY_CARE_PROVIDER_SITE_OTHER): Payer: PPO | Admitting: Family Medicine

## 2017-06-30 ENCOUNTER — Telehealth: Payer: Self-pay | Admitting: *Deleted

## 2017-06-30 DIAGNOSIS — Z23 Encounter for immunization: Secondary | ICD-10-CM | POA: Diagnosis not present

## 2017-06-30 NOTE — Telephone Encounter (Signed)
When I phone the pt this a.m, I told him that we wanted to set up a follow up colonoscopy d/t to poor prep.  No answer with first or second call; unable to schedule procedure

## 2017-06-30 NOTE — Telephone Encounter (Signed)
  Follow up Call-  Call back number 06/29/2017  Post procedure Call Back phone  # [phone]  Permission to leave phone message Yes  Some recent data might be hidden   [facility]

## 2017-06-30 NOTE — Telephone Encounter (Signed)
  Follow up Call-  Call back number 06/29/2017  Post procedure Call Back phone  # [phone]  Permission to leave phone message Yes  Some recent data might be hidden     Patient questions:  Message left to call us if necessary.  Second call.

## 2017-07-02 NOTE — Telephone Encounter (Signed)
I gave him my number - he will call when he is ready.

## 2017-10-11 ENCOUNTER — Other Ambulatory Visit (INDEPENDENT_AMBULATORY_CARE_PROVIDER_SITE_OTHER): Payer: PPO | Admitting: Family Medicine

## 2017-10-11 DIAGNOSIS — Z23 Encounter for immunization: Secondary | ICD-10-CM | POA: Diagnosis not present

## 2017-11-02 ENCOUNTER — Other Ambulatory Visit: Payer: Self-pay | Admitting: Family Medicine

## 2017-11-02 MED ORDER — NEOMYCIN-POLYMYXIN-HC 3.5-10000-1 OT SOLN: OTIC | 0 refills | Status: AC

## 2017-11-02 MED ORDER — CIPROFLOXACIN HCL 500 MG PO TABS: 500.0000 mg | ORAL_TABLET | Freq: Two times a day (BID) | ORAL | 0 refills | Status: DC

## 2017-11-02 MED ORDER — DIPHENOXYLATE-ATROPINE 2.5-0.025 MG PO TABS: 1.0000 | ORAL_TABLET | ORAL | 0 refills | Status: AC | PRN

## 2018-01-19 ENCOUNTER — Other Ambulatory Visit: Payer: Self-pay | Admitting: Family Medicine

## 2018-03-04 ENCOUNTER — Other Ambulatory Visit: Payer: Self-pay | Admitting: Family Medicine

## 2018-06-15 ENCOUNTER — Other Ambulatory Visit: Payer: Self-pay

## 2018-06-15 MED ORDER — MEFLOQUINE HCL 250 MG PO TABS: 250.0000 mg | ORAL_TABLET | ORAL | 0 refills | Status: AC

## 2018-09-20 DIAGNOSIS — H2513 Age-related nuclear cataract, bilateral: Secondary | ICD-10-CM | POA: Diagnosis not present

## 2018-09-20 DIAGNOSIS — E119 Type 2 diabetes mellitus without complications: Secondary | ICD-10-CM | POA: Diagnosis not present

## 2018-09-20 DIAGNOSIS — H40013 Open angle with borderline findings, low risk, bilateral: Secondary | ICD-10-CM | POA: Diagnosis not present

## 2018-09-20 LAB — HM DIABETES EYE EXAM

## 2018-10-09 ENCOUNTER — Encounter: Payer: Self-pay | Admitting: Family Medicine

## 2019-02-01 DIAGNOSIS — E7849 Other hyperlipidemia: Secondary | ICD-10-CM | POA: Diagnosis not present

## 2019-02-01 DIAGNOSIS — E119 Type 2 diabetes mellitus without complications: Secondary | ICD-10-CM | POA: Diagnosis not present

## 2019-02-01 DIAGNOSIS — I10 Essential (primary) hypertension: Secondary | ICD-10-CM | POA: Diagnosis not present

## 2019-09-03 DIAGNOSIS — E119 Type 2 diabetes mellitus without complications: Secondary | ICD-10-CM | POA: Diagnosis not present

## 2019-09-18 LAB — HEMOGLOBIN A1C: Hemoglobin A1C: 6

## 2019-09-24 DIAGNOSIS — E119 Type 2 diabetes mellitus without complications: Secondary | ICD-10-CM | POA: Diagnosis not present

## 2019-09-24 DIAGNOSIS — H2513 Age-related nuclear cataract, bilateral: Secondary | ICD-10-CM | POA: Diagnosis not present

## 2019-09-24 DIAGNOSIS — Z9889 Other specified postprocedural states: Secondary | ICD-10-CM | POA: Diagnosis not present

## 2019-09-24 DIAGNOSIS — H40013 Open angle with borderline findings, low risk, bilateral: Secondary | ICD-10-CM | POA: Diagnosis not present

## 2019-09-25 DIAGNOSIS — E785 Hyperlipidemia, unspecified: Secondary | ICD-10-CM | POA: Diagnosis not present

## 2019-09-25 DIAGNOSIS — I10 Essential (primary) hypertension: Secondary | ICD-10-CM | POA: Diagnosis not present

## 2019-09-25 DIAGNOSIS — Z1339 Encounter for screening examination for other mental health and behavioral disorders: Secondary | ICD-10-CM | POA: Diagnosis not present

## 2019-09-25 DIAGNOSIS — Z0000 Encounter for general adult medical examination without abnormal findings: Secondary | ICD-10-CM | POA: Diagnosis not present

## 2019-10-10 ENCOUNTER — Encounter: Payer: Self-pay | Admitting: Internal Medicine

## 2019-10-28 ENCOUNTER — Ambulatory Visit: Payer: PPO | Attending: Internal Medicine

## 2019-10-28 DIAGNOSIS — Z20822 Contact with and (suspected) exposure to covid-19: Secondary | ICD-10-CM

## 2019-10-29 LAB — NOVEL CORONAVIRUS, NAA: SARS-CoV-2, NAA: NOT DETECTED

## 2019-11-08 ENCOUNTER — Ambulatory Visit: Payer: PPO

## 2019-11-16 ENCOUNTER — Ambulatory Visit: Payer: PPO | Attending: Internal Medicine

## 2019-11-16 DIAGNOSIS — Z23 Encounter for immunization: Secondary | ICD-10-CM | POA: Insufficient documentation

## 2019-11-16 NOTE — Progress Notes (Signed)
   Covid-19 Vaccination Clinic  Name:  Adrian Bryant    MRN: [id number] DOB: Nov 03, 1950  11/16/2019  Mr. [name] was observed post Covid-19 immunization for 15 minutes without incidence. He was provided with Vaccine Information Sheet and instruction to access the V-Safe system.   Mr. [name] was instructed to call 911 with any severe reactions post vaccine: Marland Kitchen Difficulty breathing  . Swelling of your face and throat  . A fast heartbeat  . A bad rash all over your body  . Dizziness and weakness    Immunizations Administered    Name Date Dose VIS Date Route   Pfizer COVID-19 Vaccine 11/16/2019  9:56 AM 0.3 mL 09/20/2019 Intramuscular   Manufacturer: [employer], [employer]   Lot: [id number]   NDC: [address]-0

## 2019-11-29 ENCOUNTER — Ambulatory Visit: Payer: PPO

## 2019-12-11 ENCOUNTER — Ambulatory Visit: Payer: PPO | Attending: Internal Medicine

## 2019-12-11 DIAGNOSIS — Z1211 Encounter for screening for malignant neoplasm of colon: Secondary | ICD-10-CM | POA: Diagnosis not present

## 2019-12-11 DIAGNOSIS — Z23 Encounter for immunization: Secondary | ICD-10-CM | POA: Insufficient documentation

## 2019-12-11 NOTE — Progress Notes (Signed)
   Covid-19 Vaccination Clinic  Name:  CALIEB LICHTMAN    MRN: [id number] DOB: 15-Jun-1951  12/11/2019  Mr. Bekker was observed post Covid-19 immunization for 15 minutes without incident. He was provided with Vaccine Information Sheet and instruction to access the V-Safe system.   Mr. Sitzer was instructed to call 911 with any severe reactions post vaccine: Marland Kitchen Difficulty breathing  . Swelling of face and throat  . A fast heartbeat  . A bad rash all over body  . Dizziness and weakness   Immunizations Administered    Name Date Dose VIS Date Route   Pfizer COVID-19 Vaccine 12/11/2019  1:22 PM 0.3 mL 09/20/2019 Intramuscular   Manufacturer: [employer], [employer]   Lot: [id number]   NDC: [address]-0

## 2020-01-30 ENCOUNTER — Ambulatory Visit (INDEPENDENT_AMBULATORY_CARE_PROVIDER_SITE_OTHER): Payer: PPO | Admitting: Psychology

## 2020-01-30 DIAGNOSIS — F4320 Adjustment disorder, unspecified: Secondary | ICD-10-CM

## 2020-03-02 ENCOUNTER — Ambulatory Visit: Payer: PPO | Admitting: Psychology

## 2020-03-31 DIAGNOSIS — E119 Type 2 diabetes mellitus without complications: Secondary | ICD-10-CM | POA: Diagnosis not present

## 2020-03-31 DIAGNOSIS — E785 Hyperlipidemia, unspecified: Secondary | ICD-10-CM | POA: Diagnosis not present

## 2020-03-31 DIAGNOSIS — I10 Essential (primary) hypertension: Secondary | ICD-10-CM | POA: Diagnosis not present

## 2020-07-01 ENCOUNTER — Other Ambulatory Visit: Payer: Self-pay | Admitting: Radiology

## 2020-07-01 ENCOUNTER — Other Ambulatory Visit: Payer: PPO

## 2020-07-01 DIAGNOSIS — Z20822 Contact with and (suspected) exposure to covid-19: Secondary | ICD-10-CM

## 2020-07-03 LAB — NOVEL CORONAVIRUS, NAA: SARS-CoV-2, NAA: NOT DETECTED

## 2020-07-03 LAB — SARS-COV-2, NAA 2 DAY TAT

## 2020-09-23 DIAGNOSIS — Z9889 Other specified postprocedural states: Secondary | ICD-10-CM | POA: Diagnosis not present

## 2020-09-23 DIAGNOSIS — H40013 Open angle with borderline findings, low risk, bilateral: Secondary | ICD-10-CM | POA: Diagnosis not present

## 2020-09-23 DIAGNOSIS — H2513 Age-related nuclear cataract, bilateral: Secondary | ICD-10-CM | POA: Diagnosis not present

## 2020-09-23 DIAGNOSIS — E119 Type 2 diabetes mellitus without complications: Secondary | ICD-10-CM | POA: Diagnosis not present

## 2020-10-08 DIAGNOSIS — Z125 Encounter for screening for malignant neoplasm of prostate: Secondary | ICD-10-CM | POA: Diagnosis not present

## 2020-10-08 DIAGNOSIS — E119 Type 2 diabetes mellitus without complications: Secondary | ICD-10-CM | POA: Diagnosis not present

## 2020-10-08 DIAGNOSIS — E785 Hyperlipidemia, unspecified: Secondary | ICD-10-CM | POA: Diagnosis not present

## 2020-12-07 DIAGNOSIS — Z1331 Encounter for screening for depression: Secondary | ICD-10-CM | POA: Diagnosis not present

## 2020-12-07 DIAGNOSIS — R82998 Other abnormal findings in urine: Secondary | ICD-10-CM | POA: Diagnosis not present

## 2020-12-07 DIAGNOSIS — Z0000 Encounter for general adult medical examination without abnormal findings: Secondary | ICD-10-CM | POA: Diagnosis not present

## 2020-12-07 DIAGNOSIS — E785 Hyperlipidemia, unspecified: Secondary | ICD-10-CM | POA: Diagnosis not present

## 2020-12-07 DIAGNOSIS — I10 Essential (primary) hypertension: Secondary | ICD-10-CM | POA: Diagnosis not present

## 2020-12-07 DIAGNOSIS — Z1339 Encounter for screening examination for other mental health and behavioral disorders: Secondary | ICD-10-CM | POA: Diagnosis not present

## 2020-12-07 DIAGNOSIS — E119 Type 2 diabetes mellitus without complications: Secondary | ICD-10-CM | POA: Diagnosis not present

## 2020-12-07 DIAGNOSIS — M5412 Radiculopathy, cervical region: Secondary | ICD-10-CM | POA: Diagnosis not present

## 2021-02-15 ENCOUNTER — Other Ambulatory Visit (HOSPITAL_COMMUNITY): Payer: Self-pay | Admitting: *Deleted

## 2021-02-25 ENCOUNTER — Ambulatory Visit (HOSPITAL_BASED_OUTPATIENT_CLINIC_OR_DEPARTMENT_OTHER)
Admission: RE | Admit: 2021-02-25 | Discharge: 2021-02-25 | Disposition: A | Discharge status: Home / Self Care | Payer: PPO | Source: Ambulatory Visit | Attending: Cardiology | Admitting: Cardiology

## 2021-02-25 ENCOUNTER — Other Ambulatory Visit: Payer: Self-pay

## 2021-06-08 DIAGNOSIS — I700 Atherosclerosis of aorta: Secondary | ICD-10-CM | POA: Diagnosis not present

## 2021-06-08 DIAGNOSIS — R7989 Other specified abnormal findings of blood chemistry: Secondary | ICD-10-CM | POA: Diagnosis not present

## 2021-06-08 DIAGNOSIS — E1122 Type 2 diabetes mellitus with diabetic chronic kidney disease: Secondary | ICD-10-CM | POA: Diagnosis not present

## 2021-06-08 DIAGNOSIS — G5602 Carpal tunnel syndrome, left upper limb: Secondary | ICD-10-CM | POA: Diagnosis not present

## 2021-06-08 DIAGNOSIS — I10 Essential (primary) hypertension: Secondary | ICD-10-CM | POA: Diagnosis not present

## 2021-06-08 DIAGNOSIS — J929 Pleural plaque without asbestos: Secondary | ICD-10-CM | POA: Diagnosis not present

## 2021-06-08 DIAGNOSIS — E785 Hyperlipidemia, unspecified: Secondary | ICD-10-CM | POA: Diagnosis not present

## 2021-06-08 DIAGNOSIS — N1831 Chronic kidney disease, stage 3a: Secondary | ICD-10-CM | POA: Diagnosis not present

## 2021-07-13 IMAGING — CT CT CARDIAC CORONARY ARTERY CALCIUM SCORE
3 series · 13 of 20 positions shown, 15 images · non-contrast
Comparison: No priors.
COMPARISON: No priors.

Addendum:
EXAM:
OVER-READ INTERPRETATION  CT CHEST

The following report is an over-read performed by radiologist Dr.
Quorum Arlegui [REDACTED] on 02/25/2021. This
over-read does not include interpretation of cardiac or coronary
anatomy or pathology. The coronary calcium score interpretation by
the cardiologist is attached.
CLINICAL DATA: Cardiovascular Disease Risk stratification
Coronary Calcium Score
TECHNIQUE: A gated, non-contrast computed tomography scan of the heart was
performed using 3mm slice thickness. Axial images were analyzed on a
dedicated workstation. Calcium scoring of the coronary arteries was
performed using the Agatston method.

[Series 2: soft full fov 71 % · axial · 0.66mm/px · z∈[+1223,+1289]mm · 3 of 55 slices shown]
[im 11/55  vessel]
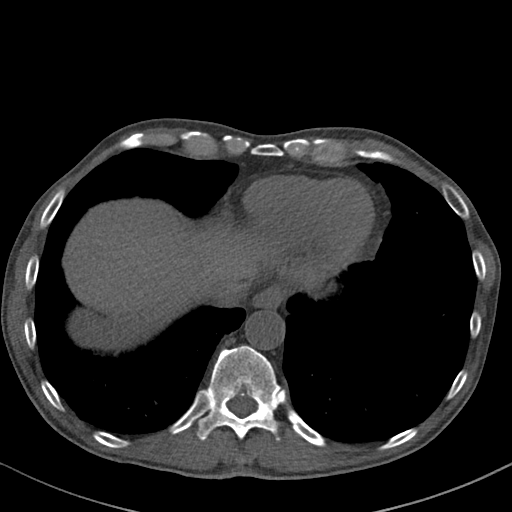
[im 22/55  vessel]
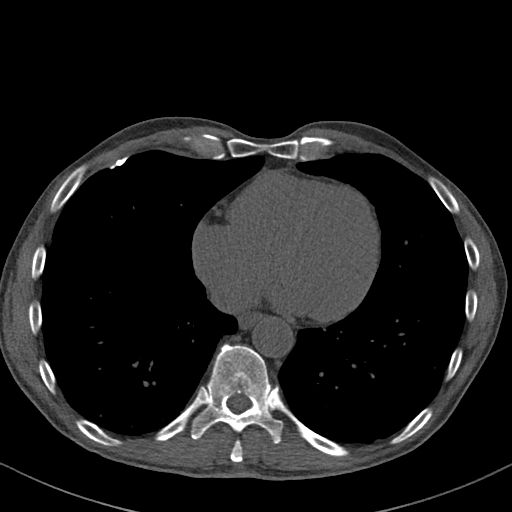
[im 33/55  vessel]
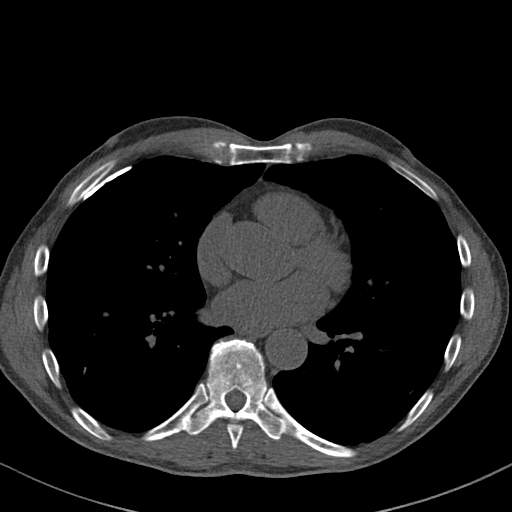

[Series 3: lungs 71 % · axial · 0.66mm/px · z∈[+1220,+1328]mm · 5 of 55 slices shown, 7 images]
[im 10/55  vessel]
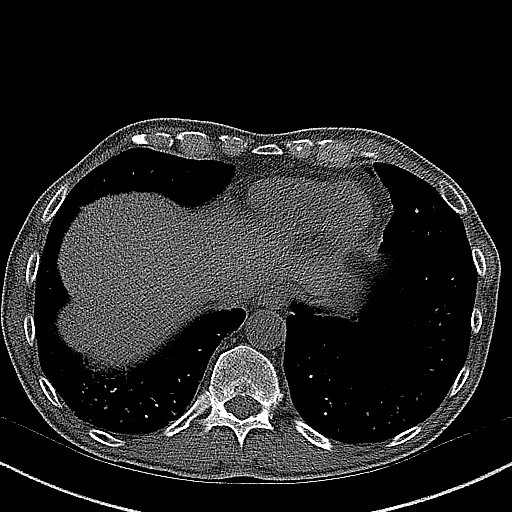
[im 10/55  lung]
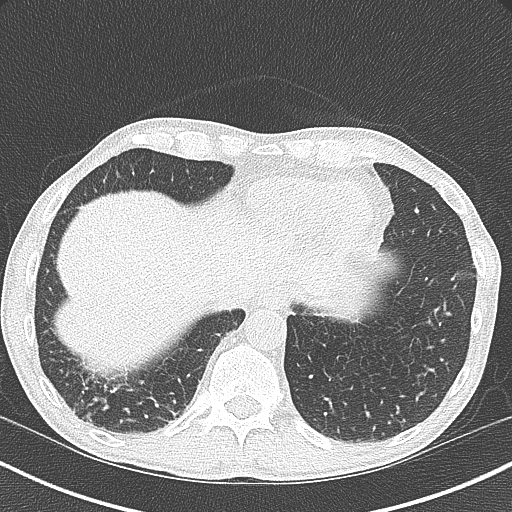
[im 19/55  vessel]
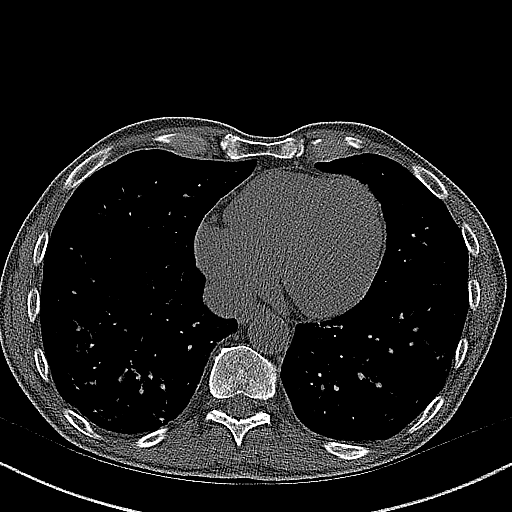
[im 28/55  vessel]
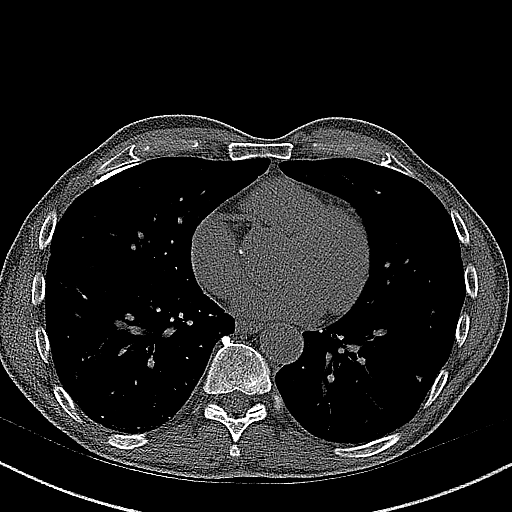
[im 37/55  vessel]
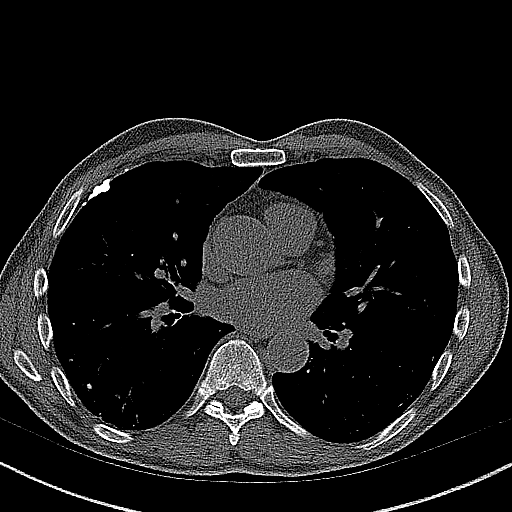
[im 46/55  vessel]
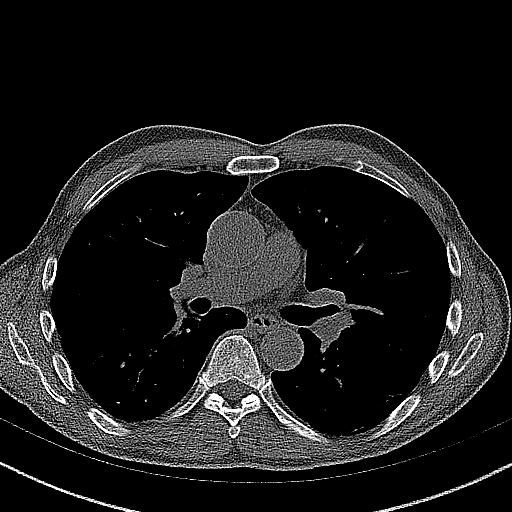
[im 46/55  lung]
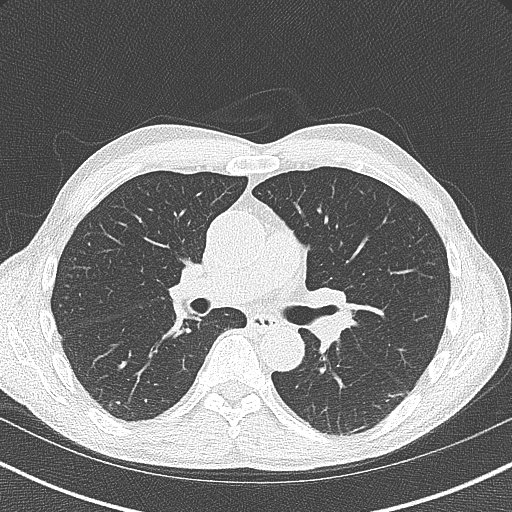

[Series 7: casc 3.0 best diast 71 % (id) · axial · 0.39mm/px · z∈[+1220,+1328]mm · 5 of 55 slices shown]
[im 10/55  vessel]
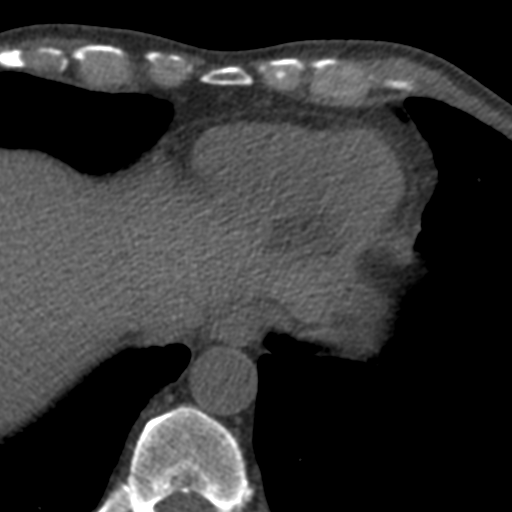
[im 19/55  vessel]
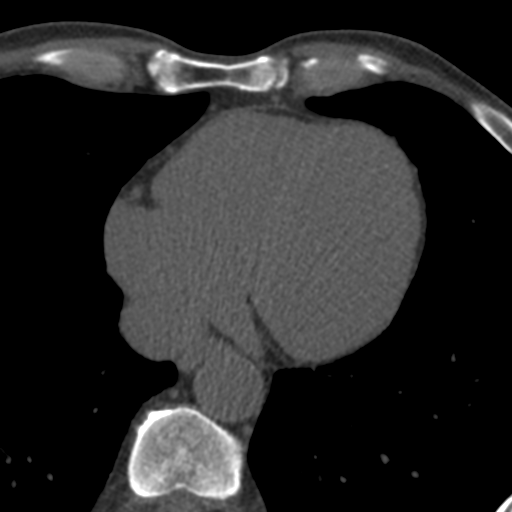
[im 28/55  vessel]
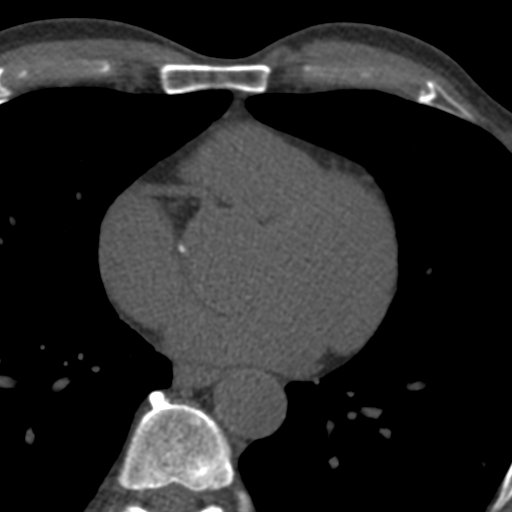
[im 37/55  vessel]
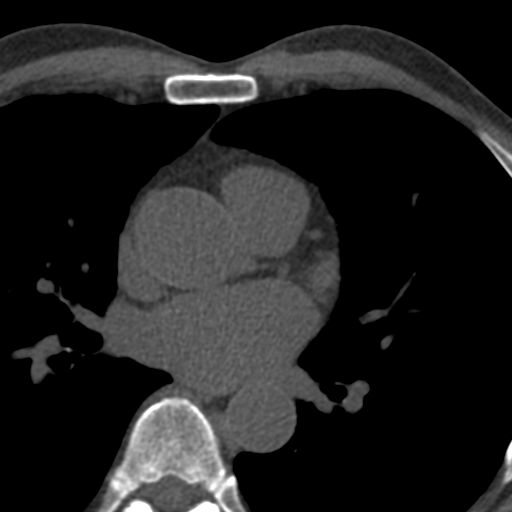
[im 46/55  vessel]
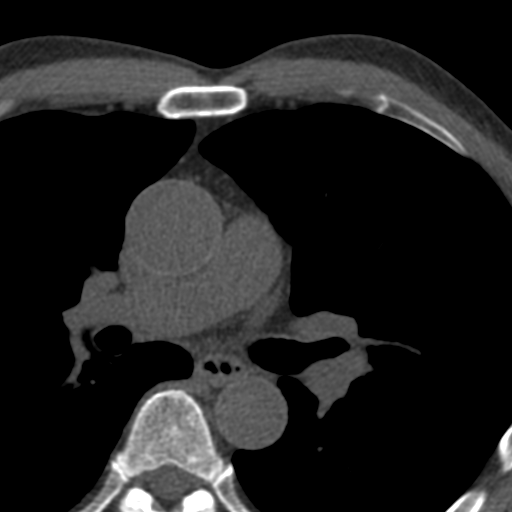

[13 of 20 positions shown; findings below may reference images not displayed]

FINDINGS: Aortic atherosclerosis. Calcified pleural plaques in the anterior
aspect of the right hemithorax. No definite calcified pleural
plaques are identified in the left hemithorax. Scattered areas of
mild septal thickening are noted in the lungs bilaterally, most
evident in the right lower lobe. Within the visualized portions of
the thorax there are no suspicious appearing pulmonary nodules or
masses, there is no acute consolidative airspace disease, no pleural
effusions, no pneumothorax and no lymphadenopathy. Visualized
portions of the upper abdomen are unremarkable. There are no
aggressive appearing lytic or blastic lesions noted in the
visualized portions of the skeleton.
IMPRESSION: 1. Calcified pleural plaques in the right hemithorax. No left-sided
calcified pleural plaques are identified to clearly indicate
asbestos related pleural disease. Right-sided pleural plaques could
relate to remote right-sided pleural hemorrhage or prior right-sided
empyema.
2. In addition, there are areas of septal thickening in the
visualize lungs bilaterally (right greater than left). If there is
any clinical concern for interstitial lung disease, follow-up
nonemergent high-resolution chest CT could be obtained along with
outpatient referral to Pulmonology for further clinical evaluation.
3.  Aortic Atherosclerosis (777ZU-V59.9).
FINDINGS: Coronary arteries: Normal origins.

Coronary Calcium Score:

Left main: 0

Left anterior descending artery: 0

Left circumflex artery: 0

Right coronary artery: 0

Total: 0

Percentile: 0

Pericardium: Normal.

Ascending Aorta: Normal caliber.

Non-cardiac: See separate report from [REDACTED].
IMPRESSION: Coronary calcium score of 0. This was 0 percentile for age-, race-,
and sex-matched controls.

Focal small plaque aortic atherosclerosis, ascending root.



If CAC=0, it is reasonable to withhold statin therapy and reassess
in 5 to 10 years, as long as higher risk conditions are absent
(diabetes mellitus, family history of premature CHD in first degree
relatives (males <55 years; females <65 years), cigarette smoking,
or LDL >=190 mg/dL).

If CAC is 1 to 99, it is reasonable to initiate statin therapy for
patients >=55 years of age.

If CAC is >=100 or >=75th percentile, it is reasonable to initiate
statin therapy at any age.

Cardiology referral should be considered for patients with CAC
scores >=400 or >=75th percentile.

*4400 AHA/ACC/AACVPR/AAPA/ABC/SKNGHWAN/MUSSET GILLET/USAIDH/Guerda/DREY/WIESLAWA/CHON LAM
Guideline on the Management of Blood Cholesterol: A Report of the
American College of Cardiology/American Heart Association Task Force
on Clinical Practice Guidelines. J Am Coll Cardiol.
6786;73(24):0669-0657.

*** End of Addendum ***
EXAM:
OVER-READ INTERPRETATION  CT CHEST

The following report is an over-read performed by radiologist Dr.
Quorum Arlegui [REDACTED] on 02/25/2021. This
over-read does not include interpretation of cardiac or coronary
anatomy or pathology. The coronary calcium score interpretation by
the cardiologist is attached.
FINDINGS: Aortic atherosclerosis. Calcified pleural plaques in the anterior
aspect of the right hemithorax. No definite calcified pleural
plaques are identified in the left hemithorax. Scattered areas of
mild septal thickening are noted in the lungs bilaterally, most
evident in the right lower lobe. Within the visualized portions of
the thorax there are no suspicious appearing pulmonary nodules or
masses, there is no acute consolidative airspace disease, no pleural
effusions, no pneumothorax and no lymphadenopathy. Visualized
portions of the upper abdomen are unremarkable. There are no
aggressive appearing lytic or blastic lesions noted in the
visualized portions of the skeleton.
IMPRESSION: 1. Calcified pleural plaques in the right hemithorax. No left-sided
calcified pleural plaques are identified to clearly indicate
asbestos related pleural disease. Right-sided pleural plaques could
relate to remote right-sided pleural hemorrhage or prior right-sided
empyema.
2. In addition, there are areas of septal thickening in the
visualize lungs bilaterally (right greater than left). If there is
any clinical concern for interstitial lung disease, follow-up
nonemergent high-resolution chest CT could be obtained along with
outpatient referral to Pulmonology for further clinical evaluation.
3.  Aortic Atherosclerosis (777ZU-V59.9).

## 2021-12-27 DIAGNOSIS — E785 Hyperlipidemia, unspecified: Secondary | ICD-10-CM | POA: Diagnosis not present

## 2021-12-27 DIAGNOSIS — R7989 Other specified abnormal findings of blood chemistry: Secondary | ICD-10-CM | POA: Diagnosis not present

## 2021-12-27 DIAGNOSIS — Z125 Encounter for screening for malignant neoplasm of prostate: Secondary | ICD-10-CM | POA: Diagnosis not present

## 2021-12-27 DIAGNOSIS — I10 Essential (primary) hypertension: Secondary | ICD-10-CM | POA: Diagnosis not present

## 2021-12-27 DIAGNOSIS — E1122 Type 2 diabetes mellitus with diabetic chronic kidney disease: Secondary | ICD-10-CM | POA: Diagnosis not present

## 2022-01-03 DIAGNOSIS — E785 Hyperlipidemia, unspecified: Secondary | ICD-10-CM | POA: Diagnosis not present

## 2022-01-03 DIAGNOSIS — Z1339 Encounter for screening examination for other mental health and behavioral disorders: Secondary | ICD-10-CM | POA: Diagnosis not present

## 2022-01-03 DIAGNOSIS — Z0000 Encounter for general adult medical examination without abnormal findings: Secondary | ICD-10-CM | POA: Diagnosis not present

## 2022-01-03 DIAGNOSIS — Z1331 Encounter for screening for depression: Secondary | ICD-10-CM | POA: Diagnosis not present

## 2022-01-03 DIAGNOSIS — J929 Pleural plaque without asbestos: Secondary | ICD-10-CM | POA: Diagnosis not present

## 2022-01-03 DIAGNOSIS — I129 Hypertensive chronic kidney disease with stage 1 through stage 4 chronic kidney disease, or unspecified chronic kidney disease: Secondary | ICD-10-CM | POA: Diagnosis not present

## 2022-01-03 DIAGNOSIS — N1831 Chronic kidney disease, stage 3a: Secondary | ICD-10-CM | POA: Diagnosis not present

## 2022-01-03 DIAGNOSIS — E1122 Type 2 diabetes mellitus with diabetic chronic kidney disease: Secondary | ICD-10-CM | POA: Diagnosis not present

## 2022-01-03 DIAGNOSIS — R82998 Other abnormal findings in urine: Secondary | ICD-10-CM | POA: Diagnosis not present

## 2022-01-03 DIAGNOSIS — I700 Atherosclerosis of aorta: Secondary | ICD-10-CM | POA: Diagnosis not present

## 2022-01-10 DIAGNOSIS — H40013 Open angle with borderline findings, low risk, bilateral: Secondary | ICD-10-CM | POA: Diagnosis not present

## 2022-01-10 DIAGNOSIS — H2513 Age-related nuclear cataract, bilateral: Secondary | ICD-10-CM | POA: Diagnosis not present

## 2022-01-10 DIAGNOSIS — E119 Type 2 diabetes mellitus without complications: Secondary | ICD-10-CM | POA: Diagnosis not present

## 2022-08-01 DIAGNOSIS — N1831 Chronic kidney disease, stage 3a: Secondary | ICD-10-CM | POA: Diagnosis not present

## 2022-08-01 DIAGNOSIS — J929 Pleural plaque without asbestos: Secondary | ICD-10-CM | POA: Diagnosis not present

## 2022-08-01 DIAGNOSIS — E785 Hyperlipidemia, unspecified: Secondary | ICD-10-CM | POA: Diagnosis not present

## 2022-08-01 DIAGNOSIS — E1122 Type 2 diabetes mellitus with diabetic chronic kidney disease: Secondary | ICD-10-CM | POA: Diagnosis not present

## 2022-08-01 DIAGNOSIS — I10 Essential (primary) hypertension: Secondary | ICD-10-CM | POA: Diagnosis not present

## 2022-08-01 DIAGNOSIS — R7989 Other specified abnormal findings of blood chemistry: Secondary | ICD-10-CM | POA: Diagnosis not present

## 2022-08-01 DIAGNOSIS — I700 Atherosclerosis of aorta: Secondary | ICD-10-CM | POA: Diagnosis not present

## 2023-01-24 DIAGNOSIS — I10 Essential (primary) hypertension: Secondary | ICD-10-CM | POA: Diagnosis not present

## 2023-01-24 DIAGNOSIS — E785 Hyperlipidemia, unspecified: Secondary | ICD-10-CM | POA: Diagnosis not present

## 2023-01-24 DIAGNOSIS — R7989 Other specified abnormal findings of blood chemistry: Secondary | ICD-10-CM | POA: Diagnosis not present

## 2023-01-24 DIAGNOSIS — Z125 Encounter for screening for malignant neoplasm of prostate: Secondary | ICD-10-CM | POA: Diagnosis not present

## 2023-01-24 DIAGNOSIS — E1122 Type 2 diabetes mellitus with diabetic chronic kidney disease: Secondary | ICD-10-CM | POA: Diagnosis not present

## 2023-01-31 DIAGNOSIS — E785 Hyperlipidemia, unspecified: Secondary | ICD-10-CM | POA: Diagnosis not present

## 2023-01-31 DIAGNOSIS — R82998 Other abnormal findings in urine: Secondary | ICD-10-CM | POA: Diagnosis not present

## 2023-01-31 DIAGNOSIS — Z1331 Encounter for screening for depression: Secondary | ICD-10-CM | POA: Diagnosis not present

## 2023-01-31 DIAGNOSIS — I10 Essential (primary) hypertension: Secondary | ICD-10-CM | POA: Diagnosis not present

## 2023-01-31 DIAGNOSIS — J929 Pleural plaque without asbestos: Secondary | ICD-10-CM | POA: Diagnosis not present

## 2023-01-31 DIAGNOSIS — N1831 Chronic kidney disease, stage 3a: Secondary | ICD-10-CM | POA: Diagnosis not present

## 2023-01-31 DIAGNOSIS — H2513 Age-related nuclear cataract, bilateral: Secondary | ICD-10-CM | POA: Diagnosis not present

## 2023-01-31 DIAGNOSIS — Z0000 Encounter for general adult medical examination without abnormal findings: Secondary | ICD-10-CM | POA: Diagnosis not present

## 2023-01-31 DIAGNOSIS — H40013 Open angle with borderline findings, low risk, bilateral: Secondary | ICD-10-CM | POA: Diagnosis not present

## 2023-01-31 DIAGNOSIS — E1122 Type 2 diabetes mellitus with diabetic chronic kidney disease: Secondary | ICD-10-CM | POA: Diagnosis not present

## 2023-01-31 DIAGNOSIS — R5381 Other malaise: Secondary | ICD-10-CM | POA: Diagnosis not present

## 2023-01-31 DIAGNOSIS — Z23 Encounter for immunization: Secondary | ICD-10-CM | POA: Diagnosis not present

## 2023-01-31 DIAGNOSIS — Z1211 Encounter for screening for malignant neoplasm of colon: Secondary | ICD-10-CM | POA: Diagnosis not present

## 2023-01-31 DIAGNOSIS — E119 Type 2 diabetes mellitus without complications: Secondary | ICD-10-CM | POA: Diagnosis not present

## 2023-01-31 DIAGNOSIS — Z1339 Encounter for screening examination for other mental health and behavioral disorders: Secondary | ICD-10-CM | POA: Diagnosis not present

## 2023-01-31 DIAGNOSIS — I700 Atherosclerosis of aorta: Secondary | ICD-10-CM | POA: Diagnosis not present

## 2023-01-31 DIAGNOSIS — R7989 Other specified abnormal findings of blood chemistry: Secondary | ICD-10-CM | POA: Diagnosis not present

## 2023-02-01 DIAGNOSIS — K023 Arrested dental caries: Secondary | ICD-10-CM | POA: Diagnosis not present

## 2023-02-24 ENCOUNTER — Encounter: Payer: Self-pay | Admitting: Gastroenterology

## 2023-03-15 ENCOUNTER — Telehealth: Payer: Self-pay | Admitting: *Deleted

## 2023-03-15 NOTE — Telephone Encounter (Signed)
Attempt to reach pt for pre-visit. LM with call back #. Will attempt other numbers in profile Attempted second # on profile list and unable to leave VM. Will attempt other # listed in profile. Attempted 3rd # listed in pt profile. LM with call back # and instructions to call back by end of the day  and reschedule pre-visit or procedure is to be canceled per protocol.

## 2023-03-15 NOTE — Telephone Encounter (Signed)
Pt PV rescheduled to 6/14 at 3:00

## 2023-03-24 ENCOUNTER — Ambulatory Visit (AMBULATORY_SURGERY_CENTER): Payer: PPO | Admitting: *Deleted

## 2023-03-24 ENCOUNTER — Encounter: Payer: Self-pay | Admitting: Gastroenterology

## 2023-03-24 VITALS — Ht 71.0 in | Wt 155.0 lb

## 2023-03-24 DIAGNOSIS — Z1211 Encounter for screening for malignant neoplasm of colon: Secondary | ICD-10-CM

## 2023-03-24 MED ORDER — NA SULFATE-K SULFATE-MG SULF 17.5-3.13-1.6 GM/177ML PO SOLN: 1.0000 | Freq: Once | ORAL | 0 refills | Status: AC

## 2023-03-24 NOTE — Progress Notes (Signed)
Pt's name and DOB verified at the beginning of the pre-visit.  Pt denies any difficulty with ambulating,sitting, laying down or rolling side to side Gave both [facility] main # and MD on call # prior to instructions.  No egg or soy allergy known to patient  No issues known to pt with past sedation with any surgeries or procedures Pt denies having issues being intubate Pt has no issues moving head neck or swallowing No FH of Malignant Hyperthermia Pt is not on diet pills Pt is not on home 02  Pt is not on blood thinners  Pt denies issues with constipation  Pt denise any abnormal heart rhythms  Pt denies any upcoming cardiac testing Pt encouraged to use to use Singlecare or Goodrx to reduce cost  Patient's chart reviewed by Adrian Bryant CNRA prior to pre-visit and patient appropriate for the [facility].  Pre-visit completed and red dot placed by patient's name on their procedure day (on provider's schedule).  . Visit by phone Pt states weight is 155 lb Instructed pt why it is important to and  to call if they have any changes in health or new medications. Directed them to the # given and on instructions.   Pt states they will.  Instructions reviewed with pt and pt states understanding. Instructed to review again prior to procedure. Pt states they will.  Instructions sent by mail with coupon and by my chart

## 2023-04-11 ENCOUNTER — Ambulatory Visit (AMBULATORY_SURGERY_CENTER): Payer: PPO | Admitting: Gastroenterology

## 2023-04-11 ENCOUNTER — Encounter: Payer: Self-pay | Admitting: Gastroenterology

## 2023-04-11 VITALS — BP 117/74 | HR 59 | Temp 98.4°F | Resp 13 | Ht 71.0 in | Wt 155.0 lb

## 2023-04-11 DIAGNOSIS — Z1211 Encounter for screening for malignant neoplasm of colon: Secondary | ICD-10-CM

## 2023-04-11 DIAGNOSIS — D123 Benign neoplasm of transverse colon: Secondary | ICD-10-CM

## 2023-04-11 DIAGNOSIS — D120 Benign neoplasm of cecum: Secondary | ICD-10-CM | POA: Diagnosis not present

## 2023-04-11 DIAGNOSIS — E119 Type 2 diabetes mellitus without complications: Secondary | ICD-10-CM | POA: Diagnosis not present

## 2023-04-11 MED ORDER — SODIUM CHLORIDE 0.9 % IV SOLN: 500.0000 mL | Freq: Once | INTRAVENOUS | Status: DC

## 2023-04-11 NOTE — Progress Notes (Signed)
Called to room to assist during endoscopic procedure.  Patient ID and intended procedure confirmed with present staff. Received instructions for my participation in the procedure from the performing physician.  

## 2023-04-11 NOTE — Op Note (Signed)
[facility] Patient Name: Adrian Bryant Procedure Date: 04/11/2023 11:41 AM MRN: [id number] Endoscopist: Sherilyn Cooter L. Myrtie Neither , MD, [id number] Age: 72 Referring MD:  Date of Birth: 05-Nov-1950 Gender: Male Account #: [id number] Procedure:                Colonoscopy Indications:              Screening for colorectal malignant neoplasm                           Poor prep on September 2018 colonoscopy Medicines:                Monitored Anesthesia Care Procedure:                Pre-Anesthesia Assessment:                           - Prior to the procedure, a History and Physical                            was performed, and patient medications and                            allergies were reviewed. The patient's tolerance of                            previous anesthesia was also reviewed. The risks                            and benefits of the procedure and the sedation                            options and risks were discussed with the patient.                            All questions were answered, and informed consent                            was obtained. Prior Anticoagulants: The patient has                            taken no anticoagulant or antiplatelet agents. ASA                            Grade Assessment: II - A patient with mild systemic                            disease. After reviewing the risks and benefits,                            the patient was deemed in satisfactory condition to                            undergo the procedure.  After obtaining informed consent, the colonoscope                            was passed under direct vision. Throughout the                            procedure, the patient's blood pressure, pulse, and                            oxygen saturations were monitored continuously. The                            Olympus Scope SN: [id number] was introduced through                            the anus and advanced  to the the cecum, identified                            by appendiceal orifice and ileocecal valve. The                            colonoscopy was somewhat difficult due to a                            redundant colon and significant looping. Successful                            completion of the procedure was aided by using                            manual pressure and straightening and shortening                            the scope to obtain bowel loop reduction. The                            patient tolerated the procedure well. The quality                            of the bowel preparation was fair throughout the                            colon despite extensive lavage. Disease residual                            opaque liquid and fibrous debris that could not be                            completely cleared). The ileocecal valve,                            appendiceal orifice, and rectum were photographed.  The bowel preparation used was SUPREP via split                            dose instruction. Scope In: 11:52:04 AM Scope Out: 12:15:19 PM Scope Withdrawal Time: 0 hours 17 minutes 52 seconds  Total Procedure Duration: 0 hours 23 minutes 15 seconds  Findings:                 The perianal and digital rectal examinations were                            normal.                           Repeat examination of right colon under NBI                            performed.                           A 6 mm polyp was found in the ileocecal valve. The                            polyp was semi-sessile. The polyp was removed with                            a cold snare. Resection and retrieval were complete.                           A diminutive polyp was found in the proximal                            transverse colon. The polyp was semi-sessile. The                            polyp was removed with a cold snare. Resection and                             retrieval were complete.                           The sigmoid colon was redundant.                           The exam was otherwise without abnormality on                            direct and retroflexion views. Complications:            No immediate complications. Estimated Blood Loss:     Estimated blood loss was minimal. Impression:               - Preparation of the colon was fair.                           - One 6 mm polyp at the ileocecal valve, removed  with a cold snare. Resected and retrieved.                           - One diminutive polyp in the proximal transverse                            colon, removed with a cold snare. Resected and                            retrieved.                           - Redundant colon.                           - The examination was otherwise normal on direct                            and retroflexion views. Recommendation:           - Patient has a contact number available for                            emergencies. The signs and symptoms of potential                            delayed complications were discussed with the                            patient. Return to normal activities tomorrow.                            Written discharge instructions were provided to the                            patient.                           - Resume previous diet.                           - Continue present medications.                           - Await pathology results.                           - Repeat colonoscopy in 1 year for surveillance                            (see prep details above, likely due to redundant                            colon anatomy). Split dose GoLytely prep for next                            colonoscopy.  Adrian Bryant L. Myrtie Neither, MD 04/11/2023 12:20:18 PM This report has been signed electronically.

## 2023-04-11 NOTE — Progress Notes (Signed)
History and Physical:  This patient presents for endoscopic testing for: Encounter Diagnosis  Name Primary?   Special screening for malignant neoplasms, colon Yes    Poor prep on Sept 2018 colonoscopy  - patient chose to take his own dosing of miralax rather than suprep as prescribed.  Took suprep this time  Patient denies chronic abdominal pain, rectal bleeding, constipation or diarrhea.   Patient is otherwise without complaints or active issues today.   Past Medical History: Past Medical History:  Diagnosis Date   Diabetes mellitus without complication (HCC)    Med and diet controled   Impaired glucose tolerance      Past Surgical History: Past Surgical History:  Procedure Laterality Date   ANTERIOR CRUCIATE LIGAMENT REPAIR Left    Age 46   EYE SURGERY Right    Correction exotropia age 57   ORIF ACROMIOCLAVICULAR JOINT Right    Age 38    Allergies: No Known Allergies  Outpatient Meds: Current Outpatient Medications  Medication Sig Dispense Refill   atorvastatin (LIPITOR) 40 MG tablet TAKE 1 TABLET EVERY DAY 90 tablet 3   JARDIANCE 25 MG TABS tablet Take 25 mg by mouth daily.     metFORMIN (GLUCOPHAGE-XR) 500 MG 24 hr tablet TAKE 2 TABLETS BY MOUTH TWICE DAILY 360 tablet 3   ramipril (ALTACE) 2.5 MG capsule Take 2.5 mg by mouth daily.     diphenoxylate-atropine (LOMOTIL) 2.5-0.025 MG tablet Take 1 tablet by mouth every 4 (four) hours as needed for diarrhea or loose stools. (Patient not taking: Reported on 03/24/2023) 30 tablet 0   mefloquine (LARIAM) 250 MG tablet Take 1 tablet (250 mg total) by mouth every 7 (seven) days. (Patient not taking: Reported on 03/24/2023) 22 tablet 0   naproxen (NAPROSYN) 500 MG tablet Take 1 tablet (500 mg total) by mouth 2 (two) times daily with a meal. 180 tablet 3   neomycin-polymyxin-hydrocortisone (CORTISPORIN) OTIC solution 2 drops as needed QID. 10 mL 0   ondansetron (ZOFRAN) 8 MG tablet Take 1 tablet (8 mg total) by mouth every 8  (eight) hours as needed for nausea or vomiting. (Patient not taking: Reported on 03/24/2023) 20 tablet 0   Current Facility-Administered Medications  Medication Dose Route Frequency Provider Last Rate Last Admin   0.9 %  sodium chloride infusion  500 mL Intravenous Once Sherrilyn Rist, MD          ___________________________________________________________________ Objective   Exam:  BP (!) 115/59   Pulse 70   Temp 98.4 F (36.9 C)   Ht 5\' 11"  (1.803 m)   Wt 155 lb (70.3 kg)   SpO2 98%   BMI 21.62 kg/m   CV: regular , S1/S2 Resp: clear to auscultation bilaterally, normal RR and effort noted GI: soft, no tenderness, with active bowel sounds.   Assessment: Encounter Diagnosis  Name Primary?   Special screening for malignant neoplasms, colon Yes     Plan: Colonoscopy   The benefits and risks of the planned procedure were described in detail with the patient or (when appropriate) their health care proxy.  Risks were outlined as including, but not limited to, bleeding, infection, perforation, adverse medication reaction leading to cardiac or pulmonary decompensation, pancreatitis (if ERCP).  The limitation of incomplete mucosal visualization was also discussed.  No guarantees or warranties were given.  The patient is appropriate for an endoscopic procedure in the ambulatory setting.   - Amada Jupiter, MD

## 2023-04-11 NOTE — Progress Notes (Signed)
VS by DT  Pt's states no medical or surgical changes since previsit or office visit.  

## 2023-04-11 NOTE — Patient Instructions (Addendum)
Resume previous diet.                           - Continue present medications.                           - Await pathology results.                           - Repeat colonoscopy in 1 year for surveillance                            (see prep details above, likely due to redundant                            colon anatomy). Split dose GoLytely prep for next                            colonoscopy.   Handout on polyps and diverticulosis given.    YOU HAD AN ENDOSCOPIC PROCEDURE TODAY AT THE [facility]:   Refer to the procedure report that was given to you for any specific questions about what was found during the examination.  If the procedure report does not answer your questions, please call your gastroenterologist to clarify.  If you requested that your care partner not be given the details of your procedure findings, then the procedure report has been included in a sealed envelope for you to review at your convenience later.  YOU SHOULD EXPECT: Some feelings of bloating in the abdomen. Passage of more gas than usual.  Walking can help get rid of the air that was put into your GI tract during the procedure and reduce the bloating. If you had a lower endoscopy (such as a colonoscopy or flexible sigmoidoscopy) you may notice spotting of blood in your stool or on the toilet paper. If you underwent a bowel prep for your procedure, you may not have a normal bowel movement for a few days.  Please Note:  You might notice some irritation and congestion in your nose or some drainage.  This is from the oxygen used during your procedure.  There is no need for concern and it should clear up in a day or so.  SYMPTOMS TO REPORT IMMEDIATELY:  Following lower endoscopy (colonoscopy or flexible sigmoidoscopy):  Excessive amounts of blood in the stool  Significant tenderness or worsening of abdominal pains  Swelling of the abdomen that is new, acute  Fever of 100F or higher   For urgent or  emergent issues, a gastroenterologist can be reached at any hour by calling [phone]. Do not use MyChart messaging for urgent concerns.    DIET:  We do recommend a small meal at first, but then you may proceed to your regular diet.  Drink plenty of fluids but you should avoid alcoholic beverages for 24 hours.  ACTIVITY:  You should plan to take it easy for the rest of today and you should NOT DRIVE or use heavy machinery until tomorrow (because of the sedation medicines used during the test).    FOLLOW UP: Our staff will call the number listed on your records the next business day following your procedure.  We will call around 7:15- 8:00 am to check on  you and address any questions or concerns that you may have regarding the information given to you following your procedure. If we do not reach you, we will leave a message.     If any biopsies were taken you will be contacted by phone or by letter within the next 1-3 weeks.  Please call us at [phone] if you have not heard about the biopsies in 3 weeks.    SIGNATURES/CONFIDENTIALITY: You and/or your care partner have signed paperwork which will be entered into your electronic medical record.  These signatures attest to the fact that that the information above on your After Visit Summary has been reviewed and is understood.  Full responsibility of the confidentiality of this discharge information lies with you and/or your care-partner.

## 2023-04-11 NOTE — Progress Notes (Signed)
A and O x3. Report to RN. Tolerated MAC anesthesia well. 

## 2023-04-12 ENCOUNTER — Telehealth: Payer: Self-pay

## 2023-04-12 NOTE — Telephone Encounter (Signed)
  Follow up Call-     04/11/2023   11:12 AM  Call back number  Post procedure Call Back phone  # [phone]  Permission to leave phone message Yes     Patient questions:  Do you have a fever, pain , or abdominal swelling? No. Pain Score  0 *  Have you tolerated food without any problems? Yes.    Have you been able to return to your normal activities? Yes.    Do you have any questions about your discharge instructions: Diet   No. Medications  No. Follow up visit  No.  Do you have questions or concerns about your Care? No.  Actions: * If pain score is 4 or above: No action needed, pain <4.

## 2023-04-18 ENCOUNTER — Encounter: Payer: Self-pay | Admitting: Gastroenterology

## 2023-07-26 DIAGNOSIS — E1122 Type 2 diabetes mellitus with diabetic chronic kidney disease: Secondary | ICD-10-CM | POA: Diagnosis not present

## 2023-07-26 DIAGNOSIS — N1831 Chronic kidney disease, stage 3a: Secondary | ICD-10-CM | POA: Diagnosis not present

## 2023-07-26 DIAGNOSIS — I129 Hypertensive chronic kidney disease with stage 1 through stage 4 chronic kidney disease, or unspecified chronic kidney disease: Secondary | ICD-10-CM | POA: Diagnosis not present

## 2023-07-26 DIAGNOSIS — E785 Hyperlipidemia, unspecified: Secondary | ICD-10-CM | POA: Diagnosis not present

## 2023-07-26 DIAGNOSIS — J929 Pleural plaque without asbestos: Secondary | ICD-10-CM | POA: Diagnosis not present

## 2023-07-26 DIAGNOSIS — R7989 Other specified abnormal findings of blood chemistry: Secondary | ICD-10-CM | POA: Diagnosis not present

## 2023-07-26 DIAGNOSIS — I700 Atherosclerosis of aorta: Secondary | ICD-10-CM | POA: Diagnosis not present

## 2023-07-26 DIAGNOSIS — Z860101 Personal history of adenomatous and serrated colon polyps: Secondary | ICD-10-CM | POA: Diagnosis not present

## 2024-01-15 NOTE — Progress Notes (Unsigned)
 [facility] [address] [place] [address] Phone: [phone] Subjective:   Adrian Bryant, am serving as a scribe for Dr. Antoine Bryant.  I'm seeing this patient by the request  of:  Adrian Bryant., MD  CC: Ankle pain acutely  [id number]  Adrian Bryant is a 73 y.o. male coming in with complaint of right  ankle injury on Sunday. Patient states that he was up on a ladder recently and the ladder slipped out from under him. Pain over achilles and lower leg. Antalgic gait.        Past Medical History:  Diagnosis Date   Diabetes mellitus without complication (HCC)    Med and diet controled   Impaired glucose tolerance    Past Surgical History:  Procedure Laterality Date   ANTERIOR CRUCIATE LIGAMENT REPAIR Left    Age 56   EYE SURGERY Right    Correction exotropia age 12   ORIF ACROMIOCLAVICULAR JOINT Right    Age 64   Social History   Socioeconomic History   Marital status: Single    Spouse name: Not on file   Number of children: Not on file   Years of education: Not on file   Highest education level: Not on file  Occupational History   Not on file  Tobacco Use   Smoking status: Never   Smokeless tobacco: Never  Vaping Use   Vaping status: Never Used  Substance and Sexual Activity   Alcohol use: Yes    Comment: Beer and wine, socially    Drug use: No   Sexual activity: Not on file  Other Topics Concern   Not on file  Social History Narrative   Not on file   Social Drivers of Health   Financial Resource Strain: Not on file  Food Insecurity: Not on file  Transportation Needs: Not on file  Physical Activity: Not on file  Stress: Not on file  Social Connections: Not on file   No Known Allergies Family History  Problem Relation Age of Onset   Breast cancer Mother    Heart disease Father    Diabetes Father    Stroke Father    Diabetes Paternal Aunt    Heart disease Paternal Uncle    Colon cancer Neg  Hx    Esophageal cancer Neg Hx    Pancreatic cancer Neg Hx    Rectal cancer Neg Hx    Stomach cancer Neg Hx    Colon polyps Neg Hx     Current Outpatient Medications (Endocrine & Metabolic):    JARDIANCE 25 MG TABS tablet, Take 25 mg by mouth daily.   metFORMIN (GLUCOPHAGE-XR) 500 MG 24 hr tablet, TAKE 2 TABLETS BY MOUTH TWICE DAILY  Current Outpatient Medications (Cardiovascular):    atorvastatin (LIPITOR) 40 MG tablet, TAKE 1 TABLET EVERY DAY   nitroGLYCERIN (NITRO-DUR) 0.2 mg/hr patch, Apply 1/4 of a patch to skin once daily.   ramipril (ALTACE) 2.5 MG capsule, Take 2.5 mg by mouth daily.   Current Outpatient Medications (Analgesics):    meloxicam (MOBIC) 15 MG tablet, Take 1 tablet (15 mg total) by mouth daily.   naproxen (NAPROSYN) 500 MG tablet, Take 1 tablet (500 mg total) by mouth 2 (two) times daily with a meal.   Current Outpatient Medications (Other):    diphenoxylate-atropine (LOMOTIL) 2.5-0.025 MG tablet, Take 1 tablet by mouth every 4 (four) hours as needed for diarrhea or loose stools.   mefloquine (LARIAM) 250  MG tablet, Take 1 tablet (250 mg total) by mouth every 7 (seven) days.   neomycin-polymyxin-hydrocortisone (CORTISPORIN) OTIC solution, 2 drops as needed QID.   ondansetron (ZOFRAN) 8 MG tablet, Take 1 tablet (8 mg total) by mouth every 8 (eight) hours as needed for nausea or vomiting.   Reviewed prior external information including notes and imaging from  primary care provider As well as notes that were available from care everywhere and other healthcare systems.  Past medical history, social, surgical and family history all reviewed in electronic medical record.  No pertanent information unless stated regarding to the chief complaint.   Review of Systems:  No headache, visual changes, nausea, vomiting, diarrhea, constipation, dizziness, abdominal pain, skin rash, fevers, chills, night sweats, weight loss, swollen lymph nodes, body aches, joint swelling,  chest pain, shortness of breath, mood changes. POSITIVE muscle aches  Objective  Blood pressure 124/84, height 5\' 11"  (1.803 m), weight 164 lb (74.4 kg).   General: No apparent distress alert and oriented x3 mood and affect normal, dressed appropriately.  HEENT: Pupils equal, extraocular movements intact  Respiratory: Patient's speak in full sentences and does not appear short of breath  Cardiovascular: No lower extremity edema, non tender, no erythema  Ankle exam shows mild swelling noted to the posterior aspect of the ankle.  Patient does have a defect palpated in the Achilles.  Patient passively does have great range of motion noted.  Actively with weightbearing and is unable to plantarflex.  Limited muscular skeletal ultrasound was performed and interpreted by Adrian Bryant, M  Limited ultrasound shows the patient does have a full-thickness or near full-thickness tear noted approximately 2 to 2-1/2 cm proximal to the insertion on the calcaneal area.  Increasing in neovascularization already noted.  Some hyperechoic changes consistent with some bleeding noted as well.  Mild hypoechoic changes with some effusion noted. Impression: Acute Achilles tear near full-thickness    Impression and Recommendations:    The above documentation has been reviewed and is accurate and complete Judi Saa, DO

## 2024-01-16 ENCOUNTER — Encounter: Payer: Self-pay | Admitting: Family Medicine

## 2024-01-16 ENCOUNTER — Other Ambulatory Visit: Payer: Self-pay

## 2024-01-16 ENCOUNTER — Ambulatory Visit: Admitting: Family Medicine

## 2024-01-16 VITALS — BP 124/84 | Ht 71.0 in | Wt 164.0 lb

## 2024-01-16 DIAGNOSIS — S86011A Strain of right Achilles tendon, initial encounter: Secondary | ICD-10-CM | POA: Insufficient documentation

## 2024-01-16 DIAGNOSIS — M25572 Pain in left ankle and joints of left foot: Secondary | ICD-10-CM | POA: Diagnosis not present

## 2024-01-16 MED ORDER — NITROGLYCERIN 0.2 MG/HR TD PT24: MEDICATED_PATCH | TRANSDERMAL | 0 refills | Status: AC

## 2024-01-16 MED ORDER — MELOXICAM 15 MG PO TABS: 15.0000 mg | ORAL_TABLET | Freq: Every day | ORAL | 0 refills | Status: DC

## 2024-01-16 NOTE — Assessment & Plan Note (Addendum)
 Patient has a Achilles tear that near full-thickness noted.  Approximately 2-1/2 cm from the insertion of the calcaneal area.  Discussed with patient at great length and we did discuss the research articles that do show that potentially conservative therapy have better outcomes than surgical intervention.  We discussed with patient and we have decided to give cam walker, nitroglycerin patches and warned of potential side effects, discussed with patient about icing regimen.  Recovery sandals in the house.  Meloxicam 15 mg prescribed to take daily for 10 days then as needed  follow-up again in 3 weeks

## 2024-01-16 NOTE — Patient Instructions (Addendum)
 Cam walker Ntroglycerin patches Nitroglycerin Protocol   Apply 1/4 nitroglycerin patch to affected area daily.  Change position of patch within the affected area every 24 hours.  You may experience a headache during the first 1-2 weeks of using the patch, these should subside.  If you experience headaches after beginning nitroglycerin patch treatment, you may take your preferred over the counter pain reliever.  Another side effect of the nitroglycerin patch is skin irritation or rash related to patch adhesive.  Please notify our office if you develop more severe headaches or rash, and stop the patch.  Tendon healing with nitroglycerin patch may require 12 to 24 weeks depending on the extent of injury.  Men should not use if taking Viagra, Cialis, or Levitra.   Do not use if you have migraines or rosacea.  Meloxicam 15mg  Keep icing Recovery sandals in the house HOKA See me again in 3 weeks ok to double book

## 2024-01-31 NOTE — Progress Notes (Signed)
 [facility] [address] [place] [address] Phone: [phone] Subjective:     I'm seeing this patient by the request  of:  Adrian Bryant., MD  CC: Right Achilles follow-up  [id number]  01/16/2024 Patient has a Achilles tear that near full-thickness noted.  Approximately 2-1/2 cm from the insertion of the calcaneal area.  Discussed with patient at great length and we did discuss the research articles that do show that potentially conservative therapy have better outcomes than surgical intervention.  We discussed with patient and we have decided to give cam walker, nitroglycerin  patches and warned of potential side effects, discussed with patient about icing regimen.  Recovery sandals in the house.  Meloxicam  15 mg prescribed to take daily for 10 days then as needed     Update 02/06/2024 Adrian Bryant is a 73 y.o. male coming in with complaint of R achilles tear. Patient states that he is doing much better. No pain.       Past Medical History:  Diagnosis Date   Diabetes mellitus without complication (HCC)    Med and diet controled   Impaired glucose tolerance    Past Surgical History:  Procedure Laterality Date   ANTERIOR CRUCIATE LIGAMENT REPAIR Left    Age 67   EYE SURGERY Right    Correction exotropia age 71   ORIF ACROMIOCLAVICULAR JOINT Right    Age 73   Social History   Socioeconomic History   Marital status: Single    Spouse name: Not on file   Number of children: Not on file   Years of education: Not on file   Highest education level: Not on file  Occupational History   Not on file  Tobacco Use   Smoking status: Never   Smokeless tobacco: Never  Vaping Use   Vaping status: Never Used  Substance and Sexual Activity   Alcohol  use: Yes    Comment: Beer and wine, socially    Drug use: No   Sexual activity: Not on file  Other Topics Concern   Not on file  Social History Narrative   Not on file   Social  Drivers of Health   Financial Resource Strain: Not on file  Food Insecurity: Not on file  Transportation Needs: Not on file  Physical Activity: Not on file  Stress: Not on file  Social Connections: Not on file   No Known Allergies Family History  Problem Relation Age of Onset   Breast cancer Mother    Heart disease Father    Diabetes Father    Stroke Father    Diabetes Paternal Aunt    Heart disease Paternal Uncle    Colon cancer Neg Hx    Esophageal cancer Neg Hx    Pancreatic cancer Neg Hx    Rectal cancer Neg Hx    Stomach cancer Neg Hx    Colon polyps Neg Hx     Current Outpatient Medications (Endocrine & Metabolic):    JARDIANCE 25 MG TABS tablet, Take 25 mg by mouth daily.   metFORMIN  (GLUCOPHAGE -XR) 500 MG 24 hr tablet, TAKE 2 TABLETS BY MOUTH TWICE DAILY  Current Outpatient Medications (Cardiovascular):    atorvastatin  (LIPITOR) 40 MG tablet, TAKE 1 TABLET EVERY DAY   nitroGLYCERIN  (NITRO-DUR ) 0.2 mg/hr patch, Apply 1/4 of a patch to skin once daily.   ramipril (ALTACE) 2.5 MG capsule, Take 2.5 mg by mouth daily.   Current Outpatient Medications (Analgesics):    meloxicam  (  MOBIC ) 15 MG tablet, Take 1 tablet (15 mg total) by mouth daily.   naproxen  (NAPROSYN ) 500 MG tablet, Take 1 tablet (500 mg total) by mouth 2 (two) times daily with a meal.   Current Outpatient Medications (Other):    diphenoxylate -atropine  (LOMOTIL ) 2.5-0.025 MG tablet, Take 1 tablet by mouth every 4 (four) hours as needed for diarrhea or loose stools.   mefloquine  (LARIAM ) 250 MG tablet, Take 1 tablet (250 mg total) by mouth every 7 (seven) days.   neomycin -polymyxin-hydrocortisone (CORTISPORIN) OTIC solution, 2 drops as needed QID.   ondansetron  (ZOFRAN ) 8 MG tablet, Take 1 tablet (8 mg total) by mouth every 8 (eight) hours as needed for nausea or vomiting.   Reviewed prior external information including notes and imaging from  primary care provider As well as notes that were available  from care everywhere and other healthcare systems.  Past medical history, social, surgical and family history all reviewed in electronic medical record.  No pertanent information unless stated regarding to the chief complaint.   Review of Systems:  No headache, visual changes, nausea, vomiting, diarrhea, constipation, dizziness, abdominal pain, skin rash, fevers, chills, night sweats, weight loss, swollen lymph nodes, body aches, joint swelling, chest pain, shortness of breath, mood changes. POSITIVE muscle aches  Objective  There were no vitals taken for this visit.   General: No apparent distress alert and oriented x3 mood and affect normal, dressed appropriately.  HEENT: Pupils equal, extraocular movements intact  Respiratory: Patient's speak in full sentences and does not appear short of breath  Cardiovascular: No lower extremity edema, non tender, no erythema  Right ankle exam shows significant decrease in the swelling that was noted of the lower extremity previously.  Patient actually does have relatively good flexion and extension with dorsi flexion of the foot.  Limited muscular skeletal ultrasound was performed and interpreted by Adrian Bryant, M  Limited ultrasound shows patient is to have a fairly large hernia full-thickness tear noted of the Achilles tendon near the musculotendinous junction.  Significant unless hypoechoic changes.  Does appear to have more fibers intact as anticipated previously.  Increase in neovascularization.    Impression and Recommendations:     The above documentation has been reviewed and is accurate and complete Adrian Radney M [name], DO

## 2024-02-06 ENCOUNTER — Encounter: Payer: Self-pay | Admitting: Family Medicine

## 2024-02-06 ENCOUNTER — Ambulatory Visit: Admitting: Family Medicine

## 2024-02-06 ENCOUNTER — Other Ambulatory Visit: Payer: Self-pay

## 2024-02-06 VITALS — BP 110/76 | HR 66 | Ht 71.0 in

## 2024-02-06 DIAGNOSIS — S86011A Strain of right Achilles tendon, initial encounter: Secondary | ICD-10-CM | POA: Diagnosis not present

## 2024-02-06 DIAGNOSIS — M25572 Pain in left ankle and joints of left foot: Secondary | ICD-10-CM | POA: Diagnosis not present

## 2024-02-06 NOTE — Patient Instructions (Signed)
 One more month for golf Can transition to shoe with heel lift if it feels good Boot in gym for next month See me in 4 weeks

## 2024-02-06 NOTE — Assessment & Plan Note (Signed)
 More improvement than anticipated.  On the nitroglycerin  patches.  No significant side effects.  Start to transition injections with any change.  Discussed with him exercises, discussed which activity significantly. 4.  Follow-up again in 4 weeks otherwise.

## 2024-02-07 DIAGNOSIS — H40013 Open angle with borderline findings, low risk, bilateral: Secondary | ICD-10-CM | POA: Diagnosis not present

## 2024-02-07 DIAGNOSIS — H3530 Unspecified macular degeneration: Secondary | ICD-10-CM | POA: Diagnosis not present

## 2024-02-07 DIAGNOSIS — E119 Type 2 diabetes mellitus without complications: Secondary | ICD-10-CM | POA: Diagnosis not present

## 2024-02-07 DIAGNOSIS — H2513 Age-related nuclear cataract, bilateral: Secondary | ICD-10-CM | POA: Diagnosis not present

## 2024-02-09 DIAGNOSIS — E1122 Type 2 diabetes mellitus with diabetic chronic kidney disease: Secondary | ICD-10-CM | POA: Diagnosis not present

## 2024-02-09 DIAGNOSIS — Z125 Encounter for screening for malignant neoplasm of prostate: Secondary | ICD-10-CM | POA: Diagnosis not present

## 2024-02-09 DIAGNOSIS — E785 Hyperlipidemia, unspecified: Secondary | ICD-10-CM | POA: Diagnosis not present

## 2024-02-09 DIAGNOSIS — I129 Hypertensive chronic kidney disease with stage 1 through stage 4 chronic kidney disease, or unspecified chronic kidney disease: Secondary | ICD-10-CM | POA: Diagnosis not present

## 2024-02-09 DIAGNOSIS — R946 Abnormal results of thyroid function studies: Secondary | ICD-10-CM | POA: Diagnosis not present

## 2024-02-09 DIAGNOSIS — N1831 Chronic kidney disease, stage 3a: Secondary | ICD-10-CM | POA: Diagnosis not present

## 2024-02-12 ENCOUNTER — Other Ambulatory Visit: Payer: Self-pay

## 2024-02-12 ENCOUNTER — Other Ambulatory Visit: Payer: Self-pay | Admitting: Family Medicine

## 2024-02-12 MED ORDER — MELOXICAM 15 MG PO TABS: 15.0000 mg | ORAL_TABLET | Freq: Every day | ORAL | 0 refills | Status: AC

## 2024-02-16 DIAGNOSIS — I700 Atherosclerosis of aorta: Secondary | ICD-10-CM | POA: Diagnosis not present

## 2024-02-16 DIAGNOSIS — R7989 Other specified abnormal findings of blood chemistry: Secondary | ICD-10-CM | POA: Diagnosis not present

## 2024-02-16 DIAGNOSIS — J929 Pleural plaque without asbestos: Secondary | ICD-10-CM | POA: Diagnosis not present

## 2024-02-16 DIAGNOSIS — Z860101 Personal history of adenomatous and serrated colon polyps: Secondary | ICD-10-CM | POA: Diagnosis not present

## 2024-02-16 DIAGNOSIS — Z1339 Encounter for screening examination for other mental health and behavioral disorders: Secondary | ICD-10-CM | POA: Diagnosis not present

## 2024-02-16 DIAGNOSIS — E785 Hyperlipidemia, unspecified: Secondary | ICD-10-CM | POA: Diagnosis not present

## 2024-02-16 DIAGNOSIS — N1831 Chronic kidney disease, stage 3a: Secondary | ICD-10-CM | POA: Diagnosis not present

## 2024-02-16 DIAGNOSIS — R82998 Other abnormal findings in urine: Secondary | ICD-10-CM | POA: Diagnosis not present

## 2024-02-16 DIAGNOSIS — S86011D Strain of right Achilles tendon, subsequent encounter: Secondary | ICD-10-CM | POA: Diagnosis not present

## 2024-02-16 DIAGNOSIS — I129 Hypertensive chronic kidney disease with stage 1 through stage 4 chronic kidney disease, or unspecified chronic kidney disease: Secondary | ICD-10-CM | POA: Diagnosis not present

## 2024-02-16 DIAGNOSIS — E1122 Type 2 diabetes mellitus with diabetic chronic kidney disease: Secondary | ICD-10-CM | POA: Diagnosis not present

## 2024-02-16 DIAGNOSIS — Z0000 Encounter for general adult medical examination without abnormal findings: Secondary | ICD-10-CM | POA: Diagnosis not present

## 2024-02-16 DIAGNOSIS — Z1331 Encounter for screening for depression: Secondary | ICD-10-CM | POA: Diagnosis not present

## 2024-03-07 NOTE — Progress Notes (Unsigned)
 [name] [address] [place] [address] Phone: [phone] Subjective:   Adrian Bryant, am serving as a scribe for Dr. Ronnell Coins.  I'm seeing this patient by the request  of:  Jeannine Milroy., MD  CC: Right ankle pain  [id number]  02/06/2024 More improvement than anticipated.  On the nitroglycerin  patches.  No significant side effects.  Start to transition injections with any change.  Discussed with him exercises, discussed which activity significantly. 4.  Follow-up again in 4 weeks otherwise.      Update 03/08/2024 Adrian Bryant is a 73 y.o. male coming in with complaint of r ankle pain.  Found to have an Achilles tear.  Has been doing well with conservative therapy.  Patient states that he is making slow progress. Little pain but burning sensation with walking. Uses boot at the gym.       Past Medical History:  Diagnosis Date   Diabetes mellitus without complication (HCC)    Med and diet controled   Impaired glucose tolerance    Past Surgical History:  Procedure Laterality Date   ANTERIOR CRUCIATE LIGAMENT REPAIR Left    Age 46   EYE SURGERY Right    Correction exotropia age 40   ORIF ACROMIOCLAVICULAR JOINT Right    Age 89   Social History   Socioeconomic History   Marital status: Single    Spouse name: Not on file   Number of children: Not on file   Years of education: Not on file   Highest education level: Not on file  Occupational History   Not on file  Tobacco Use   Smoking status: Never   Smokeless tobacco: Never  Vaping Use   Vaping status: Never Used  Substance and Sexual Activity   Alcohol  use: Yes    Comment: Beer and wine, socially    Drug use: No   Sexual activity: Not on file  Other Topics Concern   Not on file  Social History Narrative   Not on file   Social Drivers of Health   Financial Resource Strain: Not on file  Food Insecurity: Not on file  Transportation Needs: Not on  file  Physical Activity: Not on file  Stress: Not on file  Social Connections: Not on file   No Known Allergies Family History  Problem Relation Age of Onset   Breast cancer Mother    Heart disease Father    Diabetes Father    Stroke Father    Diabetes Paternal Aunt    Heart disease Paternal Uncle    Colon cancer Neg Hx    Esophageal cancer Neg Hx    Pancreatic cancer Neg Hx    Rectal cancer Neg Hx    Stomach cancer Neg Hx    Colon polyps Neg Hx     Current Outpatient Medications (Endocrine & Metabolic):    JARDIANCE 25 MG TABS tablet, Take 25 mg by mouth daily.   metFORMIN  (GLUCOPHAGE -XR) 500 MG 24 hr tablet, TAKE 2 TABLETS BY MOUTH TWICE DAILY  Current Outpatient Medications (Cardiovascular):    atorvastatin  (LIPITOR) 40 MG tablet, TAKE 1 TABLET EVERY DAY   nitroGLYCERIN  (NITRO-DUR ) 0.2 mg/hr patch, Apply 1/4 of a patch to skin once daily.   ramipril (ALTACE) 2.5 MG capsule, Take 2.5 mg by mouth daily.   Current Outpatient Medications (Analgesics):    meloxicam  (MOBIC ) 15 MG tablet, Take 1 tablet (15 mg total) by mouth daily.   naproxen  (NAPROSYN )  500 MG tablet, Take 1 tablet (500 mg total) by mouth 2 (two) times daily with a meal.   Current Outpatient Medications (Other):    diphenoxylate -atropine  (LOMOTIL ) 2.5-0.025 MG tablet, Take 1 tablet by mouth every 4 (four) hours as needed for diarrhea or loose stools.   mefloquine  (LARIAM ) 250 MG tablet, Take 1 tablet (250 mg total) by mouth every 7 (seven) days.   neomycin -polymyxin-hydrocortisone (CORTISPORIN) OTIC solution, 2 drops as needed QID.   ondansetron  (ZOFRAN ) 8 MG tablet, Take 1 tablet (8 mg total) by mouth every 8 (eight) hours as needed for nausea or vomiting.   Reviewed prior external information including notes and imaging from  primary care provider As well as notes that were available from care everywhere and other healthcare systems.  Past medical history, social, surgical and family history all reviewed  in electronic medical record.  No pertanent information unless stated regarding to the chief complaint.   Review of Systems:  No headache, visual changes, nausea, vomiting, diarrhea, constipation, dizziness, abdominal pain, skin rash, fevers, chills, night sweats, weight loss, swollen lymph nodes, body aches, joint swelling, chest pain, shortness of breath, mood changes. POSITIVE muscle aches  Objective  There were no vitals taken for this visit.   General: No apparent distress alert and oriented x3 mood and affect normal, dressed appropriately.  HEENT: Pupils equal, extraocular movements intact  Respiratory: Patient's speak in full sentences and does not appear short of breath  Cardiovascular: No lower extremity edema, non tender, no erythema  ankle exam shows significant decrease in swelling.  Still relatively good strength actually of the Achilles today.  Wound defect actually appears to be smaller than what it was previously.  Limited muscular skeletal ultrasound was performed and interpreted by Ronnell Coins, M  Limited ultrasound shows still defect noted.  Patient still has some area of some hypoechoic changes with significant improvement in scar tissue formation noted.  Do find interval improvement noted today.    Impression and Recommendations:     The above documentation has been reviewed and is accurate and complete Caliyah Sieh M Javeria Briski, DO

## 2024-03-08 ENCOUNTER — Other Ambulatory Visit: Payer: Self-pay

## 2024-03-08 ENCOUNTER — Ambulatory Visit: Admitting: Family Medicine

## 2024-03-08 ENCOUNTER — Encounter: Payer: Self-pay | Admitting: Family Medicine

## 2024-03-08 VITALS — BP 120/82 | HR 58 | Ht 71.0 in | Wt 161.0 lb

## 2024-03-08 DIAGNOSIS — S86011A Strain of right Achilles tendon, initial encounter: Secondary | ICD-10-CM

## 2024-03-08 DIAGNOSIS — M25572 Pain in left ankle and joints of left foot: Secondary | ICD-10-CM

## 2024-03-08 NOTE — Assessment & Plan Note (Signed)
 Patient has made significant strides at this time.  Already making improvement in his strength.  Do feel that he is still a little bit better before we start formal physical therapy.  Discussed with patient about icing regimen of home exercises.  Discussed with her which activities to do and which ones to avoid.  Follow-up again 5 to 6 weeks and we will consider the possibility of starting formal physical therapy at that time.  Patient's goal is to get back to September or October.

## 2024-03-08 NOTE — Patient Instructions (Signed)
 Amazing overall Keep heel lift in reg shoes Still boot in gym Can move the ankle when sitting, alphabets See you again in 5-6 weeks

## 2024-04-10 NOTE — Progress Notes (Deleted)
 [facility] [address] [place] [address] Phone: [phone] Subjective:    I'm seeing this patient by the request  of:  Loreli Elsie JONETTA Mickey., MD  CC:   [id number]  03/08/2024 Patient has made significant strides at this time. Already making improvement in his strength. Do feel that he is still a little bit better before we start formal physical therapy. Discussed with patient about icing regimen of home exercises. Discussed with her which activities to do and which ones to avoid. Follow-up again 5 to 6 weeks and we will consider the possibility of starting formal physical therapy at that time. Patient's goal is to get back to September or October.   Update 04/22/2024 Adrian Bryant is a 73 y.o. male coming in with complaint of L ankle pain. Patient states       Past Medical History:  Diagnosis Date   Diabetes mellitus without complication (HCC)    Med and diet controled   Impaired glucose tolerance    Past Surgical History:  Procedure Laterality Date   ANTERIOR CRUCIATE LIGAMENT REPAIR Left    Age 18   EYE SURGERY Right    Correction exotropia age 35   ORIF ACROMIOCLAVICULAR JOINT Right    Age 2   Social History   Socioeconomic History   Marital status: Single    Spouse name: Not on file   Number of children: Not on file   Years of education: Not on file   Highest education level: Not on file  Occupational History   Not on file  Tobacco Use   Smoking status: Never   Smokeless tobacco: Never  Vaping Use   Vaping status: Never Used  Substance and Sexual Activity   Alcohol  use: Yes    Comment: Beer and wine, socially    Drug use: No   Sexual activity: Not on file  Other Topics Concern   Not on file  Social History Narrative   Not on file   Social Drivers of Health   Financial Resource Strain: Not on file  Food Insecurity: Not on file  Transportation Needs: Not on file  Physical Activity: Not on file  Stress:  Not on file  Social Connections: Not on file   No Known Allergies Family History  Problem Relation Age of Onset   Breast cancer Mother    Heart disease Father    Diabetes Father    Stroke Father    Diabetes Paternal Aunt    Heart disease Paternal Uncle    Colon cancer Neg Hx    Esophageal cancer Neg Hx    Pancreatic cancer Neg Hx    Rectal cancer Neg Hx    Stomach cancer Neg Hx    Colon polyps Neg Hx     Current Outpatient Medications (Endocrine & Metabolic):    JARDIANCE 25 MG TABS tablet, Take 25 mg by mouth daily.   metFORMIN  (GLUCOPHAGE -XR) 500 MG 24 hr tablet, TAKE 2 TABLETS BY MOUTH TWICE DAILY  Current Outpatient Medications (Cardiovascular):    atorvastatin  (LIPITOR) 40 MG tablet, TAKE 1 TABLET EVERY DAY   nitroGLYCERIN  (NITRO-DUR ) 0.2 mg/hr patch, Apply 1/4 of a patch to skin once daily.   ramipril (ALTACE) 2.5 MG capsule, Take 2.5 mg by mouth daily.   Current Outpatient Medications (Analgesics):    meloxicam  (MOBIC ) 15 MG tablet, Take 1 tablet (15 mg total) by mouth daily.   naproxen  (NAPROSYN ) 500 MG tablet, Take 1 tablet (500 mg total)  by mouth 2 (two) times daily with a meal.   Current Outpatient Medications (Other):    diphenoxylate -atropine  (LOMOTIL ) 2.5-0.025 MG tablet, Take 1 tablet by mouth every 4 (four) hours as needed for diarrhea or loose stools.   mefloquine  (LARIAM ) 250 MG tablet, Take 1 tablet (250 mg total) by mouth every 7 (seven) days.   neomycin -polymyxin-hydrocortisone (CORTISPORIN) OTIC solution, 2 drops as needed QID.   ondansetron  (ZOFRAN ) 8 MG tablet, Take 1 tablet (8 mg total) by mouth every 8 (eight) hours as needed for nausea or vomiting.   Reviewed prior external information including notes and imaging from  primary care provider As well as notes that were available from care everywhere and other healthcare systems.  Past medical history, social, surgical and family history all reviewed in electronic medical record.  No pertanent  information unless stated regarding to the chief complaint.   Review of Systems:  No headache, visual changes, nausea, vomiting, diarrhea, constipation, dizziness, abdominal pain, skin rash, fevers, chills, night sweats, weight loss, swollen lymph nodes, body aches, joint swelling, chest pain, shortness of breath, mood changes. POSITIVE muscle aches  Objective  There were no vitals taken for this visit.   General: No apparent distress alert and oriented x3 mood and affect normal, dressed appropriately.  HEENT: Pupils equal, extraocular movements intact  Respiratory: Patient's speak in full sentences and does not appear short of breath  Cardiovascular: No lower extremity edema, non tender, no erythema      Impression and Recommendations:

## 2024-04-22 ENCOUNTER — Ambulatory Visit: Admitting: Family Medicine

## 2024-05-03 NOTE — Progress Notes (Signed)
 [facility] [address] [place] [address] Phone: [phone] Subjective:   Bryant Bryant, am serving as a scribe for Dr. Arthea Bryant.  I'm seeing this patient by the request  of:  Bryant Bryant., MD  CC: Achilles tear follow-up  YEP:Dlagzrupcz  03/08/2024 Patient has made significant strides at this time.  Already making improvement in his strength.  Do feel that he is still a little bit better before we start formal physical therapy.  Discussed with patient about icing regimen of home exercises.  Discussed with her which activities to do and which ones to avoid.  Follow-up again 5 to 6 weeks and we will consider the possibility of starting formal physical therapy at that time.  Patient's goal is to get back to September or October.      Update 05/06/2024 Bryant Bryant is a 73 y.o. male coming in with complaint of right ankle pain.  Previously found to have a Achilles tear.  Has been doing well with conservative therapy.  Patient states that he is doing ok. Stepped off a plane going down hill last month. R foot was planted on the step. Pain was sharp for a few minutes. Within a week pain was back to baseline.       Past Medical History:  Diagnosis Date   Diabetes mellitus without complication (HCC)    Med and diet controled   Impaired glucose tolerance    Past Surgical History:  Procedure Laterality Date   ANTERIOR CRUCIATE LIGAMENT REPAIR Left    Age 50   EYE SURGERY Right    Correction exotropia age 51   ORIF ACROMIOCLAVICULAR JOINT Right    Age 9   Social History   Socioeconomic History   Marital status: Married    Spouse name: Not on file   Number of children: Not on file   Years of education: Not on file   Highest education level: Not on file  Occupational History   Not on file  Tobacco Use   Smoking status: Never   Smokeless tobacco: Never  Vaping Use   Vaping status: Never Used  Substance and Sexual  Activity   Alcohol  use: Yes    Comment: Beer and wine, socially    Drug use: No   Sexual activity: Not on file  Other Topics Concern   Not on file  Social History Narrative   Not on file   Social Drivers of Health   Financial Resource Strain: Not on file  Food Insecurity: Not on file  Transportation Needs: Not on file  Physical Activity: Not on file  Stress: Not on file  Social Connections: Not on file   No Known Allergies Family History  Problem Relation Age of Onset   Breast cancer Mother    Heart disease Father    Diabetes Father    Stroke Father    Diabetes Paternal Aunt    Heart disease Paternal Uncle    Colon cancer Neg Hx    Esophageal cancer Neg Hx    Pancreatic cancer Neg Hx    Rectal cancer Neg Hx    Stomach cancer Neg Hx    Colon polyps Neg Hx     Current Outpatient Medications (Endocrine & Metabolic):    JARDIANCE 25 MG TABS tablet, Take 25 mg by mouth daily.   metFORMIN  (GLUCOPHAGE -XR) 500 MG 24 hr tablet, TAKE 2 TABLETS BY MOUTH TWICE DAILY  Current Outpatient Medications (Cardiovascular):  atorvastatin  (LIPITOR) 40 MG tablet, TAKE 1 TABLET EVERY DAY   nitroGLYCERIN  (NITRO-DUR ) 0.2 mg/hr patch, Apply 1/4 of a patch to skin once daily.   ramipril (ALTACE) 2.5 MG capsule, Take 2.5 mg by mouth daily.   Current Outpatient Medications (Analgesics):    meloxicam  (MOBIC ) 15 MG tablet, Take 1 tablet (15 mg total) by mouth daily.   naproxen  (NAPROSYN ) 500 MG tablet, Take 1 tablet (500 mg total) by mouth 2 (two) times daily with a meal.   Current Outpatient Medications (Other):    diphenoxylate -atropine  (LOMOTIL ) 2.5-0.025 MG tablet, Take 1 tablet by mouth every 4 (four) hours as needed for diarrhea or loose stools.   mefloquine  (LARIAM ) 250 MG tablet, Take 1 tablet (250 mg total) by mouth every 7 (seven) days.   neomycin -polymyxin-hydrocortisone (CORTISPORIN) OTIC solution, 2 drops as needed QID.   ondansetron  (ZOFRAN ) 8 MG tablet, Take 1 tablet (8 mg  total) by mouth every 8 (eight) hours as needed for nausea or vomiting.   Reviewed prior external information including notes and imaging from  primary care provider As well as notes that were available from care everywhere and other healthcare systems.  Past medical history, social, surgical and family history all reviewed in electronic medical record.  No pertanent information unless stated regarding to the chief complaint.   Review of Systems:  No headache, visual changes, nausea, vomiting, diarrhea, constipation, dizziness, abdominal pain, skin rash, fevers, chills, night sweats, weight loss, swollen lymph nodes, body aches, joint swelling, chest pain, shortness of breath, mood changes. POSITIVE muscle aches  Objective  Blood pressure 132/82, pulse 87, height 5' 11 (1.803 m), weight 160 lb (72.6 kg), SpO2 95%.   General: No apparent distress alert and oriented x3 mood and affect normal, dressed appropriately.  HEENT: Pupils equal, extraocular movements intact  Respiratory: Patient's speak in full sentences and does not appear short of breath  Cardiovascular: No lower extremity edema, non tender, no erythema  Ankle exam shows significant decrease in the swelling noted today.  Great strength with flexion and extension of the ankle.  Limited muscular skeletal ultrasound was performed and interpreted by Bryant Bryant, M  Limited ultrasound shows patient does have improvement noted but does have some hypoechoic changes that seems to be acute tear near some of the scar tissue noted. Impression: Acute injury to the scar tissue noted.    Impression and Recommendations:     The above documentation has been reviewed and is accurate and complete Bryant Britz M Belen Zwahlen, DO

## 2024-05-06 ENCOUNTER — Encounter: Payer: Self-pay | Admitting: Family Medicine

## 2024-05-06 ENCOUNTER — Other Ambulatory Visit: Payer: Self-pay

## 2024-05-06 ENCOUNTER — Ambulatory Visit: Admitting: Family Medicine

## 2024-05-06 VITALS — BP 132/82 | HR 87 | Ht 71.0 in | Wt 160.0 lb

## 2024-05-06 DIAGNOSIS — M25571 Pain in right ankle and joints of right foot: Secondary | ICD-10-CM | POA: Diagnosis not present

## 2024-05-06 DIAGNOSIS — S86011A Strain of right Achilles tendon, initial encounter: Secondary | ICD-10-CM

## 2024-05-06 NOTE — Patient Instructions (Signed)
 Askings hamstring Hold on back xray And gabapentin Check back in September

## 2024-05-06 NOTE — Assessment & Plan Note (Addendum)
 Improvement on noted again but patient does have hypoechoic change noted that seems to be a new tear of the scar tissue.  Still nothing that I would expect to give him any difficulty long-term.  Follow-up with me again in 3 months otherwise.  Discussed which activities to do and which ones to avoid.  Patient will follow-up again in 3 months

## 2024-05-09 ENCOUNTER — Ambulatory Visit: Admitting: Family Medicine

## 2024-05-11 ENCOUNTER — Encounter: Payer: Self-pay | Admitting: Gastroenterology

## 2024-05-17 ENCOUNTER — Ambulatory Visit: Admitting: Family Medicine

## 2024-06-20 ENCOUNTER — Ambulatory Visit: Admitting: Family Medicine
